# Patient Record
Sex: Female | Born: 1965 | Race: White | Hispanic: No | Marital: Married | Smoking: Never smoker
Health system: Southern US, Community
[De-identification: ages and names within clinical notes are randomized; demographics above are authoritative.]

## PROBLEM LIST (undated history)

## (undated) DIAGNOSIS — E039 Hypothyroidism, unspecified: Secondary | ICD-10-CM

## (undated) DIAGNOSIS — I10 Essential (primary) hypertension: Secondary | ICD-10-CM

## (undated) DIAGNOSIS — Z22322 Carrier or suspected carrier of Methicillin resistant Staphylococcus aureus: Secondary | ICD-10-CM

## (undated) DIAGNOSIS — F419 Anxiety disorder, unspecified: Secondary | ICD-10-CM

## (undated) DIAGNOSIS — A4902 Methicillin resistant Staphylococcus aureus infection, unspecified site: Secondary | ICD-10-CM

## (undated) DIAGNOSIS — F319 Bipolar disorder, unspecified: Secondary | ICD-10-CM

## (undated) DIAGNOSIS — R39198 Other difficulties with micturition: Secondary | ICD-10-CM

## (undated) DIAGNOSIS — Z8709 Personal history of other diseases of the respiratory system: Secondary | ICD-10-CM

## (undated) DIAGNOSIS — M549 Dorsalgia, unspecified: Secondary | ICD-10-CM

## (undated) DIAGNOSIS — G4730 Sleep apnea, unspecified: Secondary | ICD-10-CM

## (undated) DIAGNOSIS — G4700 Insomnia, unspecified: Secondary | ICD-10-CM

## (undated) DIAGNOSIS — F32A Depression, unspecified: Secondary | ICD-10-CM

## (undated) DIAGNOSIS — M2550 Pain in unspecified joint: Secondary | ICD-10-CM

## (undated) DIAGNOSIS — R519 Headache, unspecified: Secondary | ICD-10-CM

## (undated) DIAGNOSIS — M797 Fibromyalgia: Secondary | ICD-10-CM

## (undated) DIAGNOSIS — M1990 Unspecified osteoarthritis, unspecified site: Secondary | ICD-10-CM

## (undated) DIAGNOSIS — R51 Headache: Secondary | ICD-10-CM

## (undated) DIAGNOSIS — J45909 Unspecified asthma, uncomplicated: Secondary | ICD-10-CM

## (undated) DIAGNOSIS — K219 Gastro-esophageal reflux disease without esophagitis: Secondary | ICD-10-CM

## (undated) DIAGNOSIS — J302 Other seasonal allergic rhinitis: Secondary | ICD-10-CM

## (undated) DIAGNOSIS — F329 Major depressive disorder, single episode, unspecified: Secondary | ICD-10-CM

## (undated) DIAGNOSIS — J189 Pneumonia, unspecified organism: Secondary | ICD-10-CM

## (undated) HISTORY — PX: NECK SURGERY: SHX720

## (undated) HISTORY — PX: APPENDECTOMY: SHX54

## (undated) HISTORY — PX: TUBAL LIGATION: SHX77

## (undated) HISTORY — PX: ABDOMINAL HYSTERECTOMY: SHX81

## (undated) HISTORY — PX: CHOLECYSTECTOMY: SHX55

## (undated) HISTORY — PX: JOINT REPLACEMENT: SHX530

---

## 1998-06-29 ENCOUNTER — Encounter (HOSPITAL_COMMUNITY): Admission: RE | Admit: 1998-06-29 | Discharge: 1998-09-27 | Payer: Self-pay

## 1998-08-25 ENCOUNTER — Inpatient Hospital Stay (HOSPITAL_COMMUNITY): Admission: EM | Admit: 1998-08-25 | Discharge: 1998-08-27 | Payer: Self-pay | Admitting: Emergency Medicine

## 1999-12-04 ENCOUNTER — Encounter: Payer: Self-pay | Admitting: Internal Medicine

## 1999-12-04 ENCOUNTER — Ambulatory Visit (HOSPITAL_COMMUNITY): Admission: RE | Admit: 1999-12-04 | Discharge: 1999-12-04 | Payer: Self-pay | Admitting: Internal Medicine

## 2000-06-22 ENCOUNTER — Emergency Department (HOSPITAL_COMMUNITY): Admission: EM | Admit: 2000-06-22 | Discharge: 2000-06-22 | Payer: Self-pay | Admitting: *Deleted

## 2012-03-05 DIAGNOSIS — J189 Pneumonia, unspecified organism: Secondary | ICD-10-CM

## 2012-03-05 HISTORY — DX: Pneumonia, unspecified organism: J18.9

## 2012-07-14 DIAGNOSIS — R0681 Apnea, not elsewhere classified: Secondary | ICD-10-CM | POA: Insufficient documentation

## 2012-07-14 DIAGNOSIS — M79606 Pain in leg, unspecified: Secondary | ICD-10-CM | POA: Insufficient documentation

## 2012-07-14 DIAGNOSIS — M7989 Other specified soft tissue disorders: Secondary | ICD-10-CM | POA: Insufficient documentation

## 2012-07-31 DIAGNOSIS — I872 Venous insufficiency (chronic) (peripheral): Secondary | ICD-10-CM | POA: Insufficient documentation

## 2014-12-20 ENCOUNTER — Other Ambulatory Visit: Payer: Self-pay | Admitting: Orthopedic Surgery

## 2014-12-21 ENCOUNTER — Other Ambulatory Visit: Payer: Self-pay | Admitting: Orthopedic Surgery

## 2015-01-10 ENCOUNTER — Encounter (HOSPITAL_COMMUNITY): Payer: Self-pay

## 2015-01-10 ENCOUNTER — Encounter (HOSPITAL_COMMUNITY)
Admission: RE | Admit: 2015-01-10 | Discharge: 2015-01-10 | Disposition: A | Discharge status: Home / Self Care | Payer: BLUE CROSS/BLUE SHIELD | Source: Ambulatory Visit | Attending: Orthopedic Surgery | Admitting: Orthopedic Surgery

## 2015-01-10 ENCOUNTER — Ambulatory Visit (HOSPITAL_COMMUNITY)
Admission: RE | Admit: 2015-01-10 | Discharge: 2015-01-10 | Disposition: A | Discharge status: Home / Self Care | Payer: BLUE CROSS/BLUE SHIELD | Source: Ambulatory Visit | Attending: Orthopedic Surgery | Admitting: Orthopedic Surgery

## 2015-01-10 DIAGNOSIS — Z01812 Encounter for preprocedural laboratory examination: Secondary | ICD-10-CM | POA: Insufficient documentation

## 2015-01-10 DIAGNOSIS — I10 Essential (primary) hypertension: Secondary | ICD-10-CM | POA: Insufficient documentation

## 2015-01-10 DIAGNOSIS — K219 Gastro-esophageal reflux disease without esophagitis: Secondary | ICD-10-CM | POA: Insufficient documentation

## 2015-01-10 DIAGNOSIS — E039 Hypothyroidism, unspecified: Secondary | ICD-10-CM | POA: Diagnosis not present

## 2015-01-10 DIAGNOSIS — Z01818 Encounter for other preprocedural examination: Secondary | ICD-10-CM

## 2015-01-10 HISTORY — DX: Essential (primary) hypertension: I10

## 2015-01-10 HISTORY — DX: Headache, unspecified: R51.9

## 2015-01-10 HISTORY — DX: Unspecified osteoarthritis, unspecified site: M19.90

## 2015-01-10 HISTORY — DX: Bipolar disorder, unspecified: F31.9

## 2015-01-10 HISTORY — DX: Depression, unspecified: F32.A

## 2015-01-10 HISTORY — DX: Other difficulties with micturition: R39.198

## 2015-01-10 HISTORY — DX: Insomnia, unspecified: G47.00

## 2015-01-10 HISTORY — DX: Gastro-esophageal reflux disease without esophagitis: K21.9

## 2015-01-10 HISTORY — DX: Major depressive disorder, single episode, unspecified: F32.9

## 2015-01-10 HISTORY — DX: Hypothyroidism, unspecified: E03.9

## 2015-01-10 HISTORY — DX: Pain in unspecified joint: M25.50

## 2015-01-10 HISTORY — DX: Dorsalgia, unspecified: M54.9

## 2015-01-10 HISTORY — DX: Other seasonal allergic rhinitis: J30.2

## 2015-01-10 HISTORY — DX: Headache: R51

## 2015-01-10 HISTORY — DX: Pneumonia, unspecified organism: J18.9

## 2015-01-10 HISTORY — DX: Personal history of other diseases of the respiratory system: Z87.09

## 2015-01-10 HISTORY — DX: Fibromyalgia: M79.7

## 2015-01-10 LAB — URINALYSIS, ROUTINE W REFLEX MICROSCOPIC
Bilirubin Urine: NEGATIVE
GLUCOSE, UA: NEGATIVE mg/dL
Hgb urine dipstick: NEGATIVE
KETONES UR: NEGATIVE mg/dL
NITRITE: NEGATIVE
PROTEIN: NEGATIVE mg/dL
Specific Gravity, Urine: 1.023 (ref 1.005–1.030)
UROBILINOGEN UA: 0.2 mg/dL (ref 0.0–1.0)
pH: 7 (ref 5.0–8.0)

## 2015-01-10 LAB — BASIC METABOLIC PANEL
Anion gap: 9 (ref 5–15)
BUN: 8 mg/dL (ref 6–20)
CALCIUM: 9.5 mg/dL (ref 8.9–10.3)
CO2: 26 mmol/L (ref 22–32)
CREATININE: 0.82 mg/dL (ref 0.44–1.00)
Chloride: 101 mmol/L (ref 101–111)
GFR calc Af Amer: >60 mL/min (ref >60)
GFR calc non Af Amer: >60 mL/min (ref >60)
GLUCOSE: 121 mg/dL — AB (ref 65–99)
Potassium: 3.2 mmol/L — ABNORMAL LOW (ref 3.5–5.1)
Sodium: 136 mmol/L (ref 135–145)

## 2015-01-10 LAB — TYPE AND SCREEN
ABO/RH(D): O POS
Antibody Screen: NEGATIVE

## 2015-01-10 LAB — URINE MICROSCOPIC-ADD ON

## 2015-01-10 LAB — CBC WITH DIFFERENTIAL/PLATELET
BASOS PCT: 1 %
Basophils Absolute: 0.1 10*3/uL (ref 0.0–0.1)
EOS ABS: 0.8 10*3/uL — AB (ref 0.0–0.7)
Eosinophils Relative: 10 %
HCT: 40 % (ref 36.0–46.0)
HEMOGLOBIN: 12.7 g/dL (ref 12.0–15.0)
Lymphocytes Relative: 20 %
Lymphs Abs: 1.5 10*3/uL (ref 0.7–4.0)
MCH: 26.7 pg (ref 26.0–34.0)
MCHC: 31.8 g/dL (ref 30.0–36.0)
MCV: 84 fL (ref 78.0–100.0)
Monocytes Absolute: 0.5 10*3/uL (ref 0.1–1.0)
Monocytes Relative: 6 %
NEUTROS PCT: 63 %
Neutro Abs: 4.7 10*3/uL (ref 1.7–7.7)
PLATELETS: 332 10*3/uL (ref 150–400)
RBC: 4.76 MIL/uL (ref 3.87–5.11)
RDW: 13.7 % (ref 11.5–15.5)
WBC: 7.4 10*3/uL (ref 4.0–10.5)

## 2015-01-10 LAB — SURGICAL PCR SCREEN
MRSA, PCR: NEGATIVE
STAPHYLOCOCCUS AUREUS: NEGATIVE

## 2015-01-10 LAB — PROTIME-INR
INR: 1.04 (ref 0.00–1.49)
Prothrombin Time: 13.8 seconds (ref 11.6–15.2)

## 2015-01-10 LAB — APTT: aPTT: 30 seconds (ref 24–37)

## 2015-01-10 LAB — ABO/RH: ABO/RH(D): O POS

## 2015-01-10 MED ORDER — CHLORHEXIDINE GLUCONATE 4 % EX LIQD: 60.0000 mL | Freq: Once | CUTANEOUS | Status: DC

## 2015-01-10 NOTE — Progress Notes (Signed)
Sleep study requested from [place] office

## 2015-01-10 NOTE — Progress Notes (Addendum)
Denies having a cardiologist-did see one about 3-4 yrs ago bc of legs swelling-Dr.Payvar  Medical Md is Dr.Douglas Delena Bali  Denies  CXR in past yr  Echo report in epic from 2014  Stress test done many yrs ago  Denies ever having a heart cath  EKG in epic under care everywhere but no tracing-to request from [facility]

## 2015-01-10 NOTE — Pre-Procedure Instructions (Signed)
Deborah Maldonado  01/10/2015      [facility], [place], [name] [address] Phone: [phone] Fax: [phone]    Your procedure is scheduled on Wed, Nov 16 @ 12:45 PM  Report to [facility] Admitting at 10:45 AM  Call this number if you have problems the morning of surgery:  [phone]   Remember:  Do not eat food or drink liquids after midnight.  Take these medicines the morning of surgery with A SIP OF WATER Dymista(Azelastine-Fluticasone),Dexilant(Dexlansoprazole),Allegra(Fexofenadine),Prozac(Fluoxetine),Synthroid(Levothyroxine),Lithium(Eskalith),and Lyrica(Pregabalin)                Stop taking your Ibuprofen and Diclofenac. No Goody's,BC's,Aleve,Motrin,Advil,Fish Oil,or any Herbal Medications.    Do not wear jewelry, make-up or nail polish.  Do not wear lotions, powders, or perfumes.  You may wear deodorant.  Do not shave 48 hours prior to surgery.    Do not bring valuables to the hospital.  [facility] is not responsible for any belongings or valuables.  Contacts, dentures or bridgework may not be worn into surgery.  Leave your suitcase in the car.  After surgery it may be brought to your room.  For patients admitted to the hospital, discharge time will be determined by your treatment team.  Patients discharged the day of surgery will not be allowed to drive home.    Special instructions:  Deborah Maldonado - Preparing for Surgery  Before surgery, you can play an important role.  Because skin is not sterile, your skin needs to be as free of germs as possible.  You can reduce the number of germs on you skin by washing with CHG (chlorahexidine gluconate) soap before surgery.  CHG is an antiseptic cleaner which kills germs and bonds with the skin to continue killing germs even after washing.  Please DO NOT use if you have an allergy to CHG or antibacterial soaps.  If your skin becomes reddened/irritated  stop using the CHG and inform your nurse when you arrive at Short Stay.  Do not shave (including legs and underarms) for at least 48 hours prior to the first CHG shower.  You may shave your face.  Please follow these instructions carefully:   1.  Shower with CHG Soap the night before surgery and the                                morning of Surgery.  2.  If you choose to wash your hair, wash your hair first as usual with your       normal shampoo.  3.  After you shampoo, rinse your hair and body thoroughly to remove the                      Shampoo.  4.  Use CHG as you would any other liquid soap.  You can apply chg directly       to the skin and wash gently with scrungie or a clean washcloth.  5.  Apply the CHG Soap to your body ONLY FROM THE NECK DOWN.        Do not use on open wounds or open sores.  Avoid contact with your eyes,       ears, mouth and genitals (private parts).  Wash genitals (private parts)       with your  normal soap.  6.  Wash thoroughly, paying special attention to the area where your surgery        will be performed.  7.  Thoroughly rinse your body with warm water from the neck down.  8.  DO NOT shower/wash with your normal soap after using and rinsing off       the CHG Soap.  9.  Pat yourself dry with a clean towel.            10.  Wear clean pajamas.            11.  Place clean sheets on your bed the night of your first shower and do not        sleep with pets.  Day of Surgery  Do not apply any lotions/deoderants the morning of surgery.  Please wear clean clothes to the hospital/surgery center.    Please read over the following fact sheets that you were given. Pain Booklet, Coughing and Deep Breathing, Blood Transfusion Information, MRSA Information and Surgical Site Infection Prevention

## 2015-01-11 NOTE — Progress Notes (Signed)
Anesthesia Chart Review:  Pt is 49 year old female scheduled for R total hip arthroplasty on 01/19/2015 with Dr. Mayer Camel.   PMH includes: HTN, hypothyroidism, bipolar disorder, GERD. Never smoker. BMI 39.   Medications include: dexlansoprazole, levothyroxine, lithium, losartan-hctz, potassium.  Preoperative labs reviewed.    Chest x-ray 01/10/15 reviewed. No active cardiopulmonary disease.   EKG 08/19/14 (on paper chart): sinus rhythm. RBBB.   Echo 07/16/12: 1. Normal LV systolic function, GHWE=99-37%. No regional wall motion abnormality noted. No left ventricular hypertrophy 2. No aortic stenosis. No aortic regurgitation. Normal aortic root 3. No mitral annular calcification. No mitral valve prolapse. No mitral regurgitation 4. Tricuspid valve normal. Mild tricuspid regurgitation 5. No right atrial enlargement. No right ventricular enlargement. Mild left atrial enlargement. Atrial septum intact 6. No pericardial effusion. No pleural effusion 7. Endocarditis prophylaxis is not recommended in this patient.  If no changes, I anticipate pt can proceed with surgery as scheduled.   Willeen Cass, FNP-BC [facility] Short Stay Surgical Center/Anesthesiology Phone: [phone] 01/11/2015 2:30 PM

## 2015-01-18 MED ORDER — VANCOMYCIN HCL 10 G IV SOLR
1500.0000 mg | INTRAVENOUS | Status: AC
  Administered 2015-01-19: 1500 mg via INTRAVENOUS
  Filled 2015-01-18: qty 1500

## 2015-01-18 NOTE — H&P (Signed)
TOTAL HIP ADMISSION H&P  Patient is admitted for right total hip arthroplasty.  Subjective:  Chief Complaint: right hip pain  HPI: Deborah Maldonado, 49 y.o. female, has a history of pain and functional disability in the right hip(s) due to arthritis and patient has failed non-surgical conservative treatments for greater than 12 weeks to include NSAID's and/or analgesics, flexibility and strengthening excercises, weight reduction as appropriate and activity modification.  Onset of symptoms was gradual starting 1 years ago with gradually worsening course since that time.The patient noted no past surgery on the right hip(s).  Patient currently rates pain in the right hip at 10 out of 10 with activity. Patient has night pain, worsening of pain with activity and weight bearing, pain that interfers with activities of daily living and pain with passive range of motion. Patient has evidence of joint space narrowing by imaging studies. This condition presents safety issues increasing the risk of falls.   There is no current active infection.  There are no active problems to display for this patient.  Past Medical History  Diagnosis Date  . Hypertension     takes Hyzaar daily  . Hypothyroidism     takes Synthroid daily  . Depression     takes Prozac daily  . Seasonal allergies     uses Dymista daily and takes Allegra daily  . Pneumonia 2014  . History of bronchitis early 2016  . Headache     occasionally  . Fibromyalgia     takes Lyrica daily  . Arthritis   . Joint pain   . Back pain     facet joint encapsulated   . GERD (gastroesophageal reflux disease)     takes Dexilant daily  . Slow urinary stream     bladder stem stretched this year  . Bipolar disorder ([place])     takes Lithium daily  . Insomnia     takes Triazolam nightly    Past Surgical History  Procedure Laterality Date  . Eye surgery    . Cholecystectomy    . Appendectomy    . Abdominal hysterectomy    . Tubal ligation       No prescriptions prior to admission   Allergies  Allergen Reactions  . Penicillins Anaphylaxis  . Morphine And Related     Bad headache    Social History  Substance Use Topics  . Smoking status: Never Smoker   . Smokeless tobacco: Not on file  . Alcohol Use: No    No family history on file.   Review of Systems  Constitutional: Positive for malaise/fatigue.  Cardiovascular: Positive for leg swelling.  Gastrointestinal: Positive for heartburn.  Musculoskeletal: Positive for myalgias and joint pain.  Neurological: Positive for headaches.  Endo/Heme/Allergies: Positive for polydipsia.  Psychiatric/Behavioral: The patient is nervous/anxious and has insomnia.     Objective:  Physical Exam  Constitutional: She is oriented to person, place, and time. She appears well-developed and well-nourished.  HENT:  Head: Normocephalic and atraumatic.  Eyes: Pupils are equal, round, and reactive to light.  Neck: Normal range of motion. Neck supple.  Cardiovascular: Intact distal pulses.   Respiratory: Effort normal.  Musculoskeletal: She exhibits tenderness.  Very irritable right hip with internal and external rotation.  Foot tap is negative.  She can flex to 95, abduct to 45, abduct 20 pain with adduction.  Standing, she does have some pannus that goes over the inguinal fold.    Neurological: She is alert and oriented to person, place,  and time.  Skin: Skin is warm and dry.  Psychiatric: She has a normal mood and affect. Her behavior is normal. Judgment and thought content normal.    Vital signs in last 24 hours:    Labs:   There is no height or weight on file to calculate BMI.   Imaging Review Plain radiographs demonstrate Multiple views of the right hip are reviewed in office today.  Patient does have moderate to severe arthritis of the right hip.  She is down to her last millimeter of cartilage superiorly.  Assessment/Plan:  End stage arthritis, right hip(s)  The  patient history, physical examination, clinical judgement of the provider and imaging studies are consistent with end stage degenerative joint disease of the right hip(s) and total hip arthroplasty is deemed medically necessary. The treatment options including medical management, injection therapy, arthroscopy and arthroplasty were discussed at length. The risks and benefits of total hip arthroplasty were presented and reviewed. The risks due to aseptic loosening, infection, stiffness, dislocation/subluxation,  thromboembolic complications and other imponderables were discussed.  The patient acknowledged the explanation, agreed to proceed with the plan and consent was signed. Patient is being admitted for inpatient treatment for surgery, pain control, PT, OT, prophylactic antibiotics, VTE prophylaxis, progressive ambulation and ADL's and discharge planning.The patient is planning to be discharged home with home health services

## 2015-01-19 ENCOUNTER — Inpatient Hospital Stay (HOSPITAL_COMMUNITY): Payer: BLUE CROSS/BLUE SHIELD | Admitting: Emergency Medicine

## 2015-01-19 ENCOUNTER — Inpatient Hospital Stay (HOSPITAL_COMMUNITY): Payer: BLUE CROSS/BLUE SHIELD | Admitting: Anesthesiology

## 2015-01-19 ENCOUNTER — Encounter (HOSPITAL_COMMUNITY)
Admission: RE | Disposition: A | Discharge status: Home Health | Payer: Self-pay | Source: Ambulatory Visit | Attending: Orthopedic Surgery

## 2015-01-19 ENCOUNTER — Encounter (HOSPITAL_COMMUNITY): Payer: Self-pay | Admitting: *Deleted

## 2015-01-19 ENCOUNTER — Inpatient Hospital Stay (HOSPITAL_COMMUNITY): Payer: BLUE CROSS/BLUE SHIELD

## 2015-01-19 ENCOUNTER — Inpatient Hospital Stay (HOSPITAL_COMMUNITY)
Admission: RE | Admit: 2015-01-19 | Discharge: 2015-01-21 | DRG: 470 | Disposition: A | Discharge status: Home Health | Payer: BLUE CROSS/BLUE SHIELD | Source: Ambulatory Visit | Attending: Orthopedic Surgery | Admitting: Orthopedic Surgery

## 2015-01-19 DIAGNOSIS — F319 Bipolar disorder, unspecified: Secondary | ICD-10-CM | POA: Diagnosis present

## 2015-01-19 DIAGNOSIS — D62 Acute posthemorrhagic anemia: Secondary | ICD-10-CM | POA: Diagnosis not present

## 2015-01-19 DIAGNOSIS — Z7982 Long term (current) use of aspirin: Secondary | ICD-10-CM | POA: Diagnosis not present

## 2015-01-19 DIAGNOSIS — Z885 Allergy status to narcotic agent status: Secondary | ICD-10-CM | POA: Diagnosis not present

## 2015-01-19 DIAGNOSIS — E669 Obesity, unspecified: Secondary | ICD-10-CM | POA: Diagnosis present

## 2015-01-19 DIAGNOSIS — J302 Other seasonal allergic rhinitis: Secondary | ICD-10-CM | POA: Diagnosis present

## 2015-01-19 DIAGNOSIS — M797 Fibromyalgia: Secondary | ICD-10-CM | POA: Diagnosis present

## 2015-01-19 DIAGNOSIS — G4700 Insomnia, unspecified: Secondary | ICD-10-CM | POA: Diagnosis present

## 2015-01-19 DIAGNOSIS — K219 Gastro-esophageal reflux disease without esophagitis: Secondary | ICD-10-CM | POA: Diagnosis present

## 2015-01-19 DIAGNOSIS — Z886 Allergy status to analgesic agent status: Secondary | ICD-10-CM | POA: Diagnosis not present

## 2015-01-19 DIAGNOSIS — Z8701 Personal history of pneumonia (recurrent): Secondary | ICD-10-CM | POA: Diagnosis not present

## 2015-01-19 DIAGNOSIS — I10 Essential (primary) hypertension: Secondary | ICD-10-CM | POA: Diagnosis present

## 2015-01-19 DIAGNOSIS — M549 Dorsalgia, unspecified: Secondary | ICD-10-CM | POA: Diagnosis present

## 2015-01-19 DIAGNOSIS — Z96649 Presence of unspecified artificial hip joint: Secondary | ICD-10-CM

## 2015-01-19 DIAGNOSIS — Z6839 Body mass index (BMI) 39.0-39.9, adult: Secondary | ICD-10-CM

## 2015-01-19 DIAGNOSIS — M1611 Unilateral primary osteoarthritis, right hip: Secondary | ICD-10-CM | POA: Diagnosis present

## 2015-01-19 DIAGNOSIS — Z880 Allergy status to penicillin: Secondary | ICD-10-CM | POA: Diagnosis not present

## 2015-01-19 DIAGNOSIS — E039 Hypothyroidism, unspecified: Secondary | ICD-10-CM | POA: Diagnosis present

## 2015-01-19 HISTORY — PX: TOTAL HIP ARTHROPLASTY: SHX124

## 2015-01-19 SURGERY — ARTHROPLASTY, HIP, TOTAL,POSTERIOR APPROACH
Anesthesia: General | Site: Hip | Laterality: Right

## 2015-01-19 MED ORDER — OXYCODONE HCL 5 MG PO TABS
5.0000 mg | ORAL_TABLET | ORAL | Status: DC | PRN
  Administered 2015-01-19 – 2015-01-21 (×8): 10 mg via ORAL
  Filled 2015-01-19 (×9): qty 2

## 2015-01-19 MED ORDER — KETOROLAC TROMETHAMINE 30 MG/ML IJ SOLN
30.0000 mg | Freq: Once | INTRAMUSCULAR | Status: AC
  Administered 2015-01-19: 30 mg via INTRAVENOUS

## 2015-01-19 MED ORDER — HYDROMORPHONE HCL 1 MG/ML IJ SOLN
INTRAMUSCULAR | Status: AC
  Administered 2015-01-19: 0.5 mg via INTRAVENOUS
  Filled 2015-01-19: qty 1

## 2015-01-19 MED ORDER — FENTANYL CITRATE (PF) 250 MCG/5ML IJ SOLN
INTRAMUSCULAR | Status: AC
  Filled 2015-01-19: qty 5

## 2015-01-19 MED ORDER — FENTANYL CITRATE (PF) 100 MCG/2ML IJ SOLN
INTRAMUSCULAR | Status: DC | PRN
  Administered 2015-01-19: 50 ug via INTRAVENOUS
  Administered 2015-01-19: 150 ug via INTRAVENOUS
  Administered 2015-01-19: 50 ug via INTRAVENOUS
  Administered 2015-01-19 (×2): 100 ug via INTRAVENOUS
  Administered 2015-01-19: 150 ug via INTRAVENOUS

## 2015-01-19 MED ORDER — LIDOCAINE HCL (CARDIAC) 20 MG/ML IV SOLN
INTRAVENOUS | Status: DC | PRN
  Administered 2015-01-19: 60 mg via INTRAVENOUS

## 2015-01-19 MED ORDER — GLYCOPYRROLATE 0.2 MG/ML IJ SOLN
INTRAMUSCULAR | Status: DC | PRN
  Administered 2015-01-19: 0.4 mg via INTRAVENOUS

## 2015-01-19 MED ORDER — ONDANSETRON HCL 4 MG/2ML IJ SOLN: 4.0000 mg | Freq: Four times a day (QID) | INTRAMUSCULAR | Status: DC | PRN

## 2015-01-19 MED ORDER — DOCUSATE SODIUM 100 MG PO CAPS
100.0000 mg | ORAL_CAPSULE | Freq: Two times a day (BID) | ORAL | Status: DC
  Administered 2015-01-19 – 2015-01-21 (×4): 100 mg via ORAL
  Filled 2015-01-19 (×4): qty 1

## 2015-01-19 MED ORDER — LOSARTAN POTASSIUM-HCTZ 100-25 MG PO TABS: 1.0000 | ORAL_TABLET | Freq: Every day | ORAL | Status: DC

## 2015-01-19 MED ORDER — LACTATED RINGERS IV SOLN
INTRAVENOUS | Status: DC
  Administered 2015-01-19: 11:00:00 via INTRAVENOUS

## 2015-01-19 MED ORDER — POTASSIUM CHLORIDE CRYS ER 20 MEQ PO TBCR
40.0000 meq | EXTENDED_RELEASE_TABLET | Freq: Two times a day (BID) | ORAL | Status: DC
  Administered 2015-01-19 – 2015-01-21 (×4): 40 meq via ORAL
  Filled 2015-01-19 (×4): qty 2

## 2015-01-19 MED ORDER — MIDAZOLAM HCL 2 MG/2ML IJ SOLN
INTRAMUSCULAR | Status: AC
  Filled 2015-01-19: qty 2

## 2015-01-19 MED ORDER — SENNOSIDES-DOCUSATE SODIUM 8.6-50 MG PO TABS: 1.0000 | ORAL_TABLET | Freq: Every evening | ORAL | Status: DC | PRN

## 2015-01-19 MED ORDER — ASPIRIN EC 325 MG PO TBEC
325.0000 mg | DELAYED_RELEASE_TABLET | Freq: Every day | ORAL | Status: DC
  Administered 2015-01-20 – 2015-01-21 (×2): 325 mg via ORAL
  Filled 2015-01-19 (×2): qty 1

## 2015-01-19 MED ORDER — METHOCARBAMOL 500 MG PO TABS
ORAL_TABLET | ORAL | Status: AC
  Filled 2015-01-19: qty 1

## 2015-01-19 MED ORDER — LIDOCAINE HCL (CARDIAC) 20 MG/ML IV SOLN
INTRAVENOUS | Status: AC
  Filled 2015-01-19: qty 5

## 2015-01-19 MED ORDER — PREGABALIN 75 MG PO CAPS
225.0000 mg | ORAL_CAPSULE | Freq: Two times a day (BID) | ORAL | Status: DC
Start: 2015-01-19 — End: 2015-01-21
  Administered 2015-01-19 – 2015-01-21 (×4): 225 mg via ORAL
  Filled 2015-01-19 (×4): qty 3

## 2015-01-19 MED ORDER — HYDROMORPHONE HCL 1 MG/ML IJ SOLN
0.5000 mg | INTRAMUSCULAR | Status: DC | PRN
  Administered 2015-01-19 – 2015-01-20 (×3): 1 mg via INTRAVENOUS
  Filled 2015-01-19 (×3): qty 1

## 2015-01-19 MED ORDER — KETOROLAC TROMETHAMINE 30 MG/ML IJ SOLN
INTRAMUSCULAR | Status: AC
  Filled 2015-01-19: qty 1

## 2015-01-19 MED ORDER — PROPOFOL 10 MG/ML IV BOLUS
INTRAVENOUS | Status: AC
  Filled 2015-01-19: qty 20

## 2015-01-19 MED ORDER — INFLUENZA VAC SPLIT QUAD 0.5 ML IM SUSY
0.5000 mL | PREFILLED_SYRINGE | INTRAMUSCULAR | Status: AC
  Administered 2015-01-20: 0.5 mL via INTRAMUSCULAR
  Filled 2015-01-19: qty 0.5

## 2015-01-19 MED ORDER — LACTATED RINGERS IV SOLN
INTRAVENOUS | Status: DC | PRN
  Administered 2015-01-19 (×2): via INTRAVENOUS

## 2015-01-19 MED ORDER — SODIUM CHLORIDE 0.9 % IR SOLN
Status: DC | PRN
  Administered 2015-01-19: 1000 mL

## 2015-01-19 MED ORDER — NEOSTIGMINE METHYLSULFATE 10 MG/10ML IV SOLN
INTRAVENOUS | Status: DC | PRN
  Administered 2015-01-19: 3 mg via INTRAVENOUS

## 2015-01-19 MED ORDER — BISACODYL 5 MG PO TBEC: 5.0000 mg | DELAYED_RELEASE_TABLET | Freq: Every day | ORAL | Status: DC | PRN

## 2015-01-19 MED ORDER — OXYCODONE-ACETAMINOPHEN 5-325 MG PO TABS: 1.0000 | ORAL_TABLET | ORAL | Status: DC | PRN

## 2015-01-19 MED ORDER — PHENOL 1.4 % MT LIQD: 1.0000 | OROMUCOSAL | Status: DC | PRN

## 2015-01-19 MED ORDER — LITHIUM CARBONATE ER 450 MG PO TBCR
450.0000 mg | EXTENDED_RELEASE_TABLET | Freq: Four times a day (QID) | ORAL | Status: DC
Start: 2015-01-19 — End: 2015-01-21
  Administered 2015-01-19 – 2015-01-21 (×6): 450 mg via ORAL
  Filled 2015-01-19 (×10): qty 1

## 2015-01-19 MED ORDER — TRANEXAMIC ACID 1000 MG/10ML IV SOLN
1000.0000 mg | INTRAVENOUS | Status: AC
  Administered 2015-01-19: 1000 mg via INTRAVENOUS
  Filled 2015-01-19: qty 10

## 2015-01-19 MED ORDER — ONDANSETRON HCL 4 MG/2ML IJ SOLN
INTRAMUSCULAR | Status: AC
  Filled 2015-01-19: qty 2

## 2015-01-19 MED ORDER — BUPIVACAINE-EPINEPHRINE 0.5% -1:200000 IJ SOLN
INTRAMUSCULAR | Status: DC | PRN
  Administered 2015-01-19: 10 mL

## 2015-01-19 MED ORDER — KCL IN DEXTROSE-NACL 20-5-0.45 MEQ/L-%-% IV SOLN
INTRAVENOUS | Status: DC
Start: 2015-01-19 — End: 2015-01-21
  Administered 2015-01-19 (×2): via INTRAVENOUS
  Filled 2015-01-19: qty 1000

## 2015-01-19 MED ORDER — METHOCARBAMOL 500 MG PO TABS: 500.0000 mg | ORAL_TABLET | Freq: Two times a day (BID) | ORAL | Status: DC

## 2015-01-19 MED ORDER — PANTOPRAZOLE SODIUM 40 MG PO TBEC
40.0000 mg | DELAYED_RELEASE_TABLET | Freq: Every day | ORAL | Status: DC
  Administered 2015-01-19 – 2015-01-21 (×3): 40 mg via ORAL
  Filled 2015-01-19 (×3): qty 1

## 2015-01-19 MED ORDER — PROPOFOL 10 MG/ML IV BOLUS
INTRAVENOUS | Status: DC | PRN
  Administered 2015-01-19 (×2): 50 mg via INTRAVENOUS
  Administered 2015-01-19: 150 mg via INTRAVENOUS

## 2015-01-19 MED ORDER — FLEET ENEMA 7-19 GM/118ML RE ENEM: 1.0000 | ENEMA | Freq: Once | RECTAL | Status: DC | PRN

## 2015-01-19 MED ORDER — ACETAMINOPHEN 650 MG RE SUPP: 650.0000 mg | Freq: Four times a day (QID) | RECTAL | Status: DC | PRN

## 2015-01-19 MED ORDER — ROCURONIUM BROMIDE 50 MG/5ML IV SOLN
INTRAVENOUS | Status: AC
  Filled 2015-01-19: qty 1

## 2015-01-19 MED ORDER — METHOCARBAMOL 500 MG PO TABS
500.0000 mg | ORAL_TABLET | Freq: Four times a day (QID) | ORAL | Status: DC | PRN
  Administered 2015-01-19 – 2015-01-21 (×6): 500 mg via ORAL
  Filled 2015-01-19 (×6): qty 1

## 2015-01-19 MED ORDER — METHOCARBAMOL 1000 MG/10ML IJ SOLN
500.0000 mg | Freq: Four times a day (QID) | INTRAVENOUS | Status: DC | PRN
  Filled 2015-01-19: qty 5

## 2015-01-19 MED ORDER — OXYCODONE HCL 5 MG/5ML PO SOLN: 5.0000 mg | Freq: Once | ORAL | Status: DC | PRN

## 2015-01-19 MED ORDER — ASPIRIN EC 325 MG PO TBEC: 325.0000 mg | DELAYED_RELEASE_TABLET | Freq: Two times a day (BID) | ORAL | Status: DC

## 2015-01-19 MED ORDER — TRIAZOLAM 0.125 MG PO TABS
0.2500 mg | ORAL_TABLET | Freq: Every day | ORAL | Status: DC
  Administered 2015-01-19 – 2015-01-20 (×2): 0.25 mg via ORAL
  Filled 2015-01-19 (×2): qty 2

## 2015-01-19 MED ORDER — BUPIVACAINE-EPINEPHRINE (PF) 0.5% -1:200000 IJ SOLN
INTRAMUSCULAR | Status: AC
  Filled 2015-01-19: qty 30

## 2015-01-19 MED ORDER — ONDANSETRON HCL 4 MG/2ML IJ SOLN
INTRAMUSCULAR | Status: DC | PRN
  Administered 2015-01-19: 4 mg via INTRAVENOUS

## 2015-01-19 MED ORDER — ALUM & MAG HYDROXIDE-SIMETH 200-200-20 MG/5ML PO SUSP: 30.0000 mL | ORAL | Status: DC | PRN

## 2015-01-19 MED ORDER — LOSARTAN POTASSIUM 50 MG PO TABS
100.0000 mg | ORAL_TABLET | Freq: Every day | ORAL | Status: DC
  Administered 2015-01-20 – 2015-01-21 (×2): 100 mg via ORAL
  Filled 2015-01-19 (×2): qty 2

## 2015-01-19 MED ORDER — HYDROCHLOROTHIAZIDE 25 MG PO TABS
25.0000 mg | ORAL_TABLET | Freq: Every day | ORAL | Status: DC
  Administered 2015-01-20 – 2015-01-21 (×2): 25 mg via ORAL
  Filled 2015-01-19 (×2): qty 1

## 2015-01-19 MED ORDER — DEXAMETHASONE SODIUM PHOSPHATE 10 MG/ML IJ SOLN
10.0000 mg | Freq: Once | INTRAMUSCULAR | Status: AC
  Administered 2015-01-20: 10 mg via INTRAVENOUS
  Filled 2015-01-19: qty 1

## 2015-01-19 MED ORDER — DIPHENHYDRAMINE HCL 12.5 MG/5ML PO ELIX: 12.5000 mg | ORAL_SOLUTION | ORAL | Status: DC | PRN

## 2015-01-19 MED ORDER — MIDAZOLAM HCL 5 MG/5ML IJ SOLN
INTRAMUSCULAR | Status: DC | PRN
  Administered 2015-01-19: 2 mg via INTRAVENOUS

## 2015-01-19 MED ORDER — LORATADINE 10 MG PO TABS
10.0000 mg | ORAL_TABLET | Freq: Every day | ORAL | Status: DC
  Administered 2015-01-19 – 2015-01-21 (×3): 10 mg via ORAL
  Filled 2015-01-19 (×3): qty 1

## 2015-01-19 MED ORDER — ACETAMINOPHEN 325 MG PO TABS
650.0000 mg | ORAL_TABLET | Freq: Four times a day (QID) | ORAL | Status: DC | PRN
  Administered 2015-01-21: 650 mg via ORAL
  Filled 2015-01-19: qty 2

## 2015-01-19 MED ORDER — METOCLOPRAMIDE HCL 5 MG/ML IJ SOLN: 5.0000 mg | Freq: Three times a day (TID) | INTRAMUSCULAR | Status: DC | PRN

## 2015-01-19 MED ORDER — OXYCODONE HCL 5 MG PO TABS: 5.0000 mg | ORAL_TABLET | Freq: Once | ORAL | Status: DC | PRN

## 2015-01-19 MED ORDER — KCL IN DEXTROSE-NACL 20-5-0.45 MEQ/L-%-% IV SOLN
INTRAVENOUS | Status: AC
  Filled 2015-01-19: qty 1000

## 2015-01-19 MED ORDER — FLUOXETINE HCL 20 MG PO CAPS
20.0000 mg | ORAL_CAPSULE | Freq: Every day | ORAL | Status: DC
Start: 2015-01-19 — End: 2015-01-21
  Administered 2015-01-19 – 2015-01-21 (×3): 20 mg via ORAL
  Filled 2015-01-19 (×3): qty 1

## 2015-01-19 MED ORDER — GLYCOPYRROLATE 0.2 MG/ML IJ SOLN
INTRAMUSCULAR | Status: AC
  Filled 2015-01-19: qty 2

## 2015-01-19 MED ORDER — METOCLOPRAMIDE HCL 5 MG PO TABS: 5.0000 mg | ORAL_TABLET | Freq: Three times a day (TID) | ORAL | Status: DC | PRN

## 2015-01-19 MED ORDER — HYDROMORPHONE HCL 1 MG/ML IJ SOLN
0.2500 mg | INTRAMUSCULAR | Status: DC | PRN
  Administered 2015-01-19 (×3): 0.5 mg via INTRAVENOUS

## 2015-01-19 MED ORDER — ONDANSETRON HCL 4 MG PO TABS: 4.0000 mg | ORAL_TABLET | Freq: Four times a day (QID) | ORAL | Status: DC | PRN

## 2015-01-19 MED ORDER — LEVOTHYROXINE SODIUM 150 MCG PO TABS
150.0000 ug | ORAL_TABLET | Freq: Every day | ORAL | Status: DC
  Administered 2015-01-20 – 2015-01-21 (×2): 150 ug via ORAL
  Filled 2015-01-19 (×2): qty 1

## 2015-01-19 MED ORDER — NEOSTIGMINE METHYLSULFATE 10 MG/10ML IV SOLN
INTRAVENOUS | Status: AC
  Filled 2015-01-19: qty 1

## 2015-01-19 MED ORDER — MENTHOL 3 MG MT LOZG: 1.0000 | LOZENGE | OROMUCOSAL | Status: DC | PRN

## 2015-01-19 MED ORDER — PROMETHAZINE HCL 25 MG/ML IJ SOLN: 6.2500 mg | INTRAMUSCULAR | Status: DC | PRN

## 2015-01-19 MED ORDER — DEXTROSE-NACL 5-0.45 % IV SOLN: INTRAVENOUS | Status: DC

## 2015-01-19 MED ORDER — DIPHENHYDRAMINE HCL 25 MG PO TABS
50.0000 mg | ORAL_TABLET | Freq: Every day | ORAL | Status: DC
  Administered 2015-01-19 – 2015-01-20 (×2): 50 mg via ORAL
  Filled 2015-01-19 (×5): qty 2

## 2015-01-19 MED ORDER — ROCURONIUM BROMIDE 100 MG/10ML IV SOLN
INTRAVENOUS | Status: DC | PRN
  Administered 2015-01-19: 50 mg via INTRAVENOUS
  Administered 2015-01-19: 20 mg via INTRAVENOUS

## 2015-01-19 MED ORDER — AZELASTINE-FLUTICASONE 137-50 MCG/ACT NA SUSP: 1.0000 | Freq: Two times a day (BID) | NASAL | Status: DC

## 2015-01-19 SURGICAL SUPPLY — 61 items
BLADE SAW SGTL 18X1.27X75 (BLADE) ×2 IMPLANT
BRUSH FEMORAL CANAL (MISCELLANEOUS) IMPLANT
CAPT HIP TOTAL 2 ×1 IMPLANT
COVER BACK TABLE 24X17X13 BIG (DRAPES) IMPLANT
COVER SURGICAL LIGHT HANDLE (MISCELLANEOUS) ×3 IMPLANT
DRAPE IMP U-DRAPE 54X76 (DRAPES) ×2 IMPLANT
DRAPE ORTHO SPLIT 77X108 STRL (DRAPES) ×2
DRAPE PROXIMA HALF (DRAPES) ×2 IMPLANT
DRAPE SURG ORHT 6 SPLT 77X108 (DRAPES) ×1 IMPLANT
DRAPE U-SHAPE 47X51 STRL (DRAPES) ×2 IMPLANT
DRILL BIT 7/64X5 (BIT) ×2 IMPLANT
DRSG AQUACEL AG ADV 3.5X14 (GAUZE/BANDAGES/DRESSINGS) ×1 IMPLANT
DURAPREP 26ML APPLICATOR (WOUND CARE) ×3 IMPLANT
ELECT BLADE 4.0 EZ CLEAN MEGAD (MISCELLANEOUS)
ELECT REM PT RETURN 9FT ADLT (ELECTROSURGICAL) ×2
ELECTRODE BLDE 4.0 EZ CLN MEGD (MISCELLANEOUS) IMPLANT
ELECTRODE REM PT RTRN 9FT ADLT (ELECTROSURGICAL) ×1 IMPLANT
GLOVE BIO SURGEON STRL SZ7.5 (GLOVE) ×2 IMPLANT
GLOVE BIO SURGEON STRL SZ8.5 (GLOVE) ×4 IMPLANT
GLOVE BIOGEL PI IND STRL 6.5 (GLOVE) IMPLANT
GLOVE BIOGEL PI IND STRL 7.5 (GLOVE) IMPLANT
GLOVE BIOGEL PI IND STRL 8 (GLOVE) ×2 IMPLANT
GLOVE BIOGEL PI IND STRL 9 (GLOVE) ×1 IMPLANT
GLOVE BIOGEL PI INDICATOR 6.5 (GLOVE) ×1
GLOVE BIOGEL PI INDICATOR 7.5 (GLOVE) ×1
GLOVE BIOGEL PI INDICATOR 8 (GLOVE) ×2
GLOVE BIOGEL PI INDICATOR 9 (GLOVE) ×1
GLOVE SURG SS PI 6.5 STRL IVOR (GLOVE) ×2 IMPLANT
GOWN STRL REUS W/ TWL LRG LVL3 (GOWN DISPOSABLE) ×2 IMPLANT
GOWN STRL REUS W/ TWL XL LVL3 (GOWN DISPOSABLE) ×2 IMPLANT
GOWN STRL REUS W/TWL LRG LVL3 (GOWN DISPOSABLE) ×4
GOWN STRL REUS W/TWL XL LVL3 (GOWN DISPOSABLE) ×4
HANDPIECE INTERPULSE COAX TIP (DISPOSABLE)
HOOD PEEL AWAY FACE SHEILD DIS (HOOD) ×4 IMPLANT
KIT BASIN OR (CUSTOM PROCEDURE TRAY) ×2 IMPLANT
KIT ROOM TURNOVER OR (KITS) ×2 IMPLANT
MANIFOLD NEPTUNE II (INSTRUMENTS) ×2 IMPLANT
NEEDLE 22X1 1/2 (OR ONLY) (NEEDLE) ×2 IMPLANT
NS IRRIG 1000ML POUR BTL (IV SOLUTION) ×2 IMPLANT
PACK TOTAL JOINT (CUSTOM PROCEDURE TRAY) ×2 IMPLANT
PACK UNIVERSAL I (CUSTOM PROCEDURE TRAY) ×2 IMPLANT
PAD ARMBOARD 7.5X6 YLW CONV (MISCELLANEOUS) ×4 IMPLANT
PASSER SUT SWANSON 36MM LOOP (INSTRUMENTS) ×2 IMPLANT
PRESSURIZER FEMORAL UNIV (MISCELLANEOUS) IMPLANT
SET HNDPC FAN SPRY TIP SCT (DISPOSABLE) IMPLANT
SPONGE LAP 18X18 X RAY DECT (DISPOSABLE) ×1 IMPLANT
SUT ETHIBOND 2 V 37 (SUTURE) ×2 IMPLANT
SUT VIC AB 0 CT1 27 (SUTURE) ×2
SUT VIC AB 0 CT1 27XBRD ANBCTR (SUTURE) IMPLANT
SUT VIC AB 0 CTB1 27 (SUTURE) ×2 IMPLANT
SUT VIC AB 1 CTX 36 (SUTURE) ×2
SUT VIC AB 1 CTX36XBRD ANBCTR (SUTURE) ×1 IMPLANT
SUT VIC AB 2-0 CTB1 (SUTURE) ×2 IMPLANT
SUT VIC AB 3-0 SH 27 (SUTURE) ×2
SUT VIC AB 3-0 SH 27X BRD (SUTURE) ×1 IMPLANT
SYR CONTROL 10ML LL (SYRINGE) ×2 IMPLANT
TOWEL OR 17X24 6PK STRL BLUE (TOWEL DISPOSABLE) ×2 IMPLANT
TOWEL OR 17X26 10 PK STRL BLUE (TOWEL DISPOSABLE) ×2 IMPLANT
TOWER CARTRIDGE SMART MIX (DISPOSABLE) IMPLANT
TRAY FOLEY CATH 14FR (SET/KITS/TRAYS/PACK) IMPLANT
WATER STERILE IRR 1000ML POUR (IV SOLUTION) ×5 IMPLANT

## 2015-01-19 NOTE — Transfer of Care (Signed)
Immediate Anesthesia Transfer of Care Note  Patient: Deborah Maldonado  Procedure(s) Performed: Procedure(s): RIGHT TOTAL HIP ARTHROPLASTY (Right)  Patient Location: PACU  Anesthesia Type:General  Level of Consciousness: awake, alert , oriented and patient cooperative  Airway & Oxygen Therapy: Patient Spontanous Breathing and Patient connected to face mask oxygen  Post-op Assessment: Report given to RN, Post -op Vital signs reviewed and stable, Patient moving all extremities and Patient moving all extremities X 4  Post vital signs: Reviewed and stable  Last Vitals:  Filed Vitals:   01/19/15 1035  BP: 138/93  Pulse: 86  Temp: 36.9 C  Resp: 18    Complications: No apparent anesthesia complications

## 2015-01-19 NOTE — Anesthesia Postprocedure Evaluation (Signed)
  Anesthesia Post-op Note  Patient: Deborah Maldonado  Procedure(s) Performed: Procedure(s): RIGHT TOTAL HIP ARTHROPLASTY (Right)  Patient Location: PACU  Anesthesia Type:General  Level of Consciousness: awake and alert   Airway and Oxygen Therapy: Patient Spontanous Breathing  Post-op Pain: mild  Post-op Assessment: Post-op Vital signs reviewed              Post-op Vital Signs: Reviewed  Last Vitals:  Filed Vitals:   01/19/15 1635  BP: 130/72  Pulse: 81  Temp: 36.4 C  Resp: 12    Complications: No apparent anesthesia complications

## 2015-01-19 NOTE — Anesthesia Preprocedure Evaluation (Addendum)
Anesthesia Evaluation  Patient identified by MRN, date of birth, ID band Patient awake    Reviewed: Allergy & Precautions, NPO status , Patient's Chart, lab work & pertinent test results  Airway Mallampati: II  TM Distance: >3 FB Neck ROM: Full    Dental  (+) Dental Advisory Given   Pulmonary neg pulmonary ROS,    breath sounds clear to auscultation       Cardiovascular hypertension, Pt. on medications  Rhythm:Regular Rate:Normal     Neuro/Psych Depression Bipolar Disorder negative neurological ROS     GI/Hepatic Neg liver ROS, GERD  ,  Endo/Other  Hypothyroidism   Renal/GU negative Renal ROS     Musculoskeletal  (+) Arthritis , Fibromyalgia -  Abdominal   Peds  Hematology negative hematology ROS (+)   Anesthesia Other Findings   Reproductive/Obstetrics                            Lab Results  Component Value Date   WBC 7.4 01/10/2015   HGB 12.7 01/10/2015   HCT 40.0 01/10/2015   MCV 84.0 01/10/2015   PLT 332 01/10/2015   Lab Results  Component Value Date   CREATININE 0.82 01/10/2015   BUN 8 01/10/2015   NA 136 01/10/2015   K 3.2* 01/10/2015   CL 101 01/10/2015   CO2 26 01/10/2015   Lab Results  Component Value Date   INR 1.04 01/10/2015    Anesthesia Physical Anesthesia Plan  ASA: II  Anesthesia Plan: General   Post-op Pain Management:    Induction: Intravenous  Airway Management Planned: Oral ETT  Additional Equipment:   Intra-op Plan:   Post-operative Plan: Extubation in OR  Informed Consent: I have reviewed the patients History and Physical, chart, labs and discussed the procedure including the risks, benefits and alternatives for the proposed anesthesia with the patient or authorized representative who has indicated his/her understanding and acceptance.   Dental advisory given  Plan Discussed with: CRNA  Anesthesia Plan Comments:         Anesthesia Quick Evaluation

## 2015-01-19 NOTE — Interval H&P Note (Signed)
History and Physical Interval Note:  01/19/2015 1:07 PM  Deborah Maldonado  has presented today for surgery, with the diagnosis of RIGHT HIP OSTEOARTHRITIS  The various methods of treatment have been discussed with the patient and family. After consideration of risks, benefits and other options for treatment, the patient has consented to  Procedure(s): TOTAL HIP ARTHROPLASTY (Right) as a surgical intervention .  The patient's history has been reviewed, patient examined, no change in status, stable for surgery.  I have reviewed the patient's chart and labs.  Questions were answered to the patient's satisfaction.     Kerin Salen

## 2015-01-19 NOTE — Anesthesia Procedure Notes (Signed)
Procedure Name: Intubation Date/Time: 01/19/2015 1:41 PM Performed by: Izora Gala Pre-anesthesia Checklist: Patient identified, Emergency Drugs available, Suction available and Patient being monitored Patient Re-evaluated:Patient Re-evaluated prior to inductionOxygen Delivery Method: Circle system utilized Preoxygenation: Pre-oxygenation with 100% oxygen Intubation Type: IV induction Ventilation: Mask ventilation without difficulty Laryngoscope Size: Miller and 3 Grade View: Grade I Tube type: Oral Tube size: 7.0 mm Number of attempts: 1 Airway Equipment and Method: Stylet Placement Confirmation: ETT inserted through vocal cords under direct vision,  positive ETCO2 and breath sounds checked- equal and bilateral Secured at: 22 cm Tube secured with: Tape Dental Injury: Teeth and Oropharynx as per pre-operative assessment

## 2015-01-19 NOTE — Op Note (Signed)
PATIENT ID:      Deborah Maldonado  MRN:     [id number] DOB/AGE:    11-20-1965 / 49 y.o.       OPERATIVE REPORT    DATE OF PROCEDURE:  01/19/2015       PREOPERATIVE DIAGNOSIS:  RIGHT HIP OSTEOARTHRITIS                                                       Estimated body mass index is 39.13 kg/(m^2) as calculated from the following:   Height as of this encounter: 5\' 7"  (1.702 m).   Weight as of this encounter: 113.354 kg (249 lb 14.4 oz).     POSTOPERATIVE DIAGNOSIS:  RIGHT HIP OSTEOARTHRITIS                                                           PROCEDURE:  R total hip arthroplasty using a 48 mm DePuy Pinnacle  Cup, [employer], 10-degree polyethylene liner index superior  and posterior, a +0 32 mm ceramic head, a 18x13x150x42 SROM Stem, 18B Sleeve  SURGEON: Kealani Leckey J    ASSISTANT:   Eric K. Barton Dubois  (present throughout entire procedure and necessary for timely completion of the procedure)  ANESTHESIA: GET  BLOOD LOSS: 400 FLUID REPLACEMENT: 1600 crystalloid Tranexamic Acid: 1gm iv DRAINS: None COMPLICATIONS: None    INDICATIONS FOR PROCEDURE:Patient with end-stage arthritis of the R hip.  X-rays show bone-on-bone arthritic changes, peri chondral cyts. Despite conservative measures with observation, anti-inflammatory medicine, narcotics, use of a cane, has severe unremitting pain and can ambulate only 5 blocks before resting.  Patient desires elective right total hip arthroplasty to decrease pain and increase function. The risks, benefits, and alternatives were discussed at length including but not limited to the risks of infection, bleeding, nerve injury, stiffness, blood clots, the need for revision surgery, cardiopulmonary complications, among others, and they were willing to proceed.Benefits have been discussed. Questions answered.     PROCEDURE IN DETAIL: The patient was identified by armband,  received preoperative IV antibiotics in the holding area at [facility], taken to the operating room , appropriate anesthetic monitors  were attached and general endotracheal anesthesia induced. Foley catheter was inserted. Patient was rolled into the L lateral decubitus position and fixed there with a Stulberg Mark II pelvic clamp and the R lower extremity was then prepped and draped  in the usual sterile fashion from the ankle to the hemipelvis. A time-out  procedure was performed. The skin along the lateral hip and thigh  infiltrated with 10 mL of 0.5% Marcaine and epinephrine solution. We  then made a posterolateral approach to the hip. With a #10 blade, 20 cm  incision through skin and subcutaneous tissue down to the level of the  IT band. Small bleeders were identified and cauterized. IT band cut in  line with skin incision exposing the greater trochanter. A Cobra retractor was placed between the gluteus minimus and the superior hip joint capsule, and a spiked Cobra between the quadratus femoris and the inferior hip joint capsule. This isolated the short  external rotators  and piriformis tendons. These were tagged with a #2 Ethibond  suture and cut off their insertion on the intertrochanteric crest. The posterior  capsule was then developed into an acetabular-based flap from Posterior Superior off of the acetabulum out over the femoral neck and back posterior inferior to the acetabular rim. This flap was tagged with two #2 Ethibond sutures and retracted protecting the sciatic nerve. This exposed the arthritic femoral head and osteophytes. The hip was then flexed and internally rotated, dislocating the femoral head and a standard neck cut performed 1 fingerbreadth above the lesser trochanter.  A spiked Cobra was placed in the cotyloid notch and a Hohmann retractor was then used to lever the femur anteriorly off of the anterior pelvic column. A posterior-inferior wing retractor was placed at the junction of the acetabulum and the ischium completing the  acetabular exposure.We then removed the peripheral osteophytes and labrum from the acetabulum. We then reamed the acetabulum up to 48 mm with basket reamers obtaining good coverage in all quadrants, irrigated out with normal  saline solution and hammered into place a 48 mm pinnacle cup in 45  degrees of abduction and about 20 degrees of anteversion. More  peripheral osteophytes removed, the apex hole eliminator was placed, and a 10-degree liner placed with the  IndexPS. The hip was then flexed and internally rotated exposing the  proximal femur, which was entered with the box cutting chisel, the initiating reamer followed by axial reaming up to 13.66mm full depth, and 14 partial depth. We then conically reamed up to 18. And milled the calcar to 18B. We then placed the trial sleeve, and a trial stem. A trial reduction was then  performed with a +0  32-mm ball on the standard neck and  excellent stability was noted with at 90 of flexion with 70 of  internal rotation and then full extension withexternal rotation. The hip  could not be dislocated in full extension. The knee could easily flex  to about 140 degrees. We also stretched the abductors at this point,  because of the preexisting adductor contractures. All trial components  were then removed. The real sleeve was then hammered into place, through the sleeve we reamed with a 13.5 reamer to ensure that the stem did not become incarcerated. The stem itself was then inserted in 10deg anteversion in relation to the calcar.  At this point, a + 0 32-mm ceramic head was  hammered on the stem. The hip was reduced. We checked our stability  one more time and found to be excellent. The wound was once again  thoroughly irrigated out with normal saline solution pulse lavage. The  capsular flap and short external rotators were repaired back to the  intertrochanteric crest through drill holes with a #2 Ethibond suture.  The IT band was closed with running 1  Vicryl suture. The subcutaneous  tissue with 0 and 2-0 undyed Vicryl suture and the skin with running  3-0 Vivryl SQ suture. Aquacil dessing was applied. The patient was then unclamped, rolled supine, awaken extubated and taken to recovery room without difficulty in stable condition.   Frederik Pear J 01/19/2015, 2:54 PM

## 2015-01-19 NOTE — Discharge Instructions (Signed)
,INSTRUCTIONS AFTER JOINT REPLACEMENT   o Remove items at home which could result in a fall. This includes throw rugs or furniture in walking pathways o ICE to the affected joint every three hours while awake for 30 minutes at a time, for at least the first 3-5 days, and then as needed for pain and swelling.  Continue to use ice for pain and swelling. You may notice swelling that will progress down to the foot and ankle.  This is normal after surgery.  Elevate your leg when you are not up walking on it.   o Continue to use the breathing machine you got in the hospital (incentive spirometer) which will help keep your temperature down.  It is common for your temperature to cycle up and down following surgery, especially at night when you are not up moving around and exerting yourself.  The breathing machine keeps your lungs expanded and your temperature down.   DIET:  As you were doing prior to hospitalization, we recommend a well-balanced diet.  DRESSING / WOUND CARE / SHOWERING  Keep the surgical dressing until follow up.  The dressing is water proof, so you can shower without any extra covering.  IF THE DRESSING FALLS OFF or the wound gets wet inside, change the dressing with sterile gauze.  Please use good hand washing techniques before changing the dressing.  Do not use any lotions or creams on the incision until instructed by your surgeon.    ACTIVITY  o Increase activity slowly as tolerated, but follow the weight bearing instructions below.   o No driving for 6 weeks or until further direction given by your physician.  You cannot drive while taking narcotics.  o No lifting or carrying greater than 10 lbs. until further directed by your surgeon. o Avoid periods of inactivity such as sitting longer than an hour when not asleep. This helps prevent blood clots.  o You may return to work once you are authorized by your doctor.     WEIGHT BEARING   Weight bearing as tolerated with assist  device (walker, cane, etc) as directed, use it as long as suggested by your surgeon or therapist, typically at least 4-6 weeks.   EXERCISES  Results after joint replacement surgery are often greatly improved when you follow the exercise, range of motion and muscle strengthening exercises prescribed by your doctor. Safety measures are also important to protect the joint from further injury. Any time any of these exercises cause you to have increased pain or swelling, decrease what you are doing until you are comfortable again and then slowly increase them. If you have problems or questions, call your caregiver or physical therapist for advice.   Rehabilitation is important following a joint replacement. After just a few days of immobilization, the muscles of the leg can become weakened and shrink (atrophy).  These exercises are designed to build up the tone and strength of the thigh and leg muscles and to improve motion. Often times heat used for twenty to thirty minutes before working out will loosen up your tissues and help with improving the range of motion but do not use heat for the first two weeks following surgery (sometimes heat can increase post-operative swelling).   These exercises can be done on a training (exercise) mat, on the floor, on a table or on a bed. Use whatever works the best and is most comfortable for you.    Use music or television while you are exercising so that  the exercises are a pleasant break in your day. This will make your life better with the exercises acting as a break in your routine that you can look forward to.   Perform all exercises about fifteen times, three times per day or as directed.  You should exercise both the operative leg and the other leg as well. ° °Exercises include: °  °• Quad Sets - Tighten up the muscle on the front of the thigh (Quad) and hold for 5-10 seconds.   °• Straight Leg Raises - With your knee straight (if you were given a brace, keep it on),  lift the leg to 60 degrees, hold for 3 seconds, and slowly lower the leg.  Perform this exercise against resistance later as your leg gets stronger.  °• Leg Slides: Lying on your back, slowly slide your foot toward your buttocks, bending your knee up off the floor (only go as far as is comfortable). Then slowly slide your foot back down until your leg is flat on the floor again.  °• Angel Wings: Lying on your back spread your legs to the side as far apart as you can without causing discomfort.  °• Hamstring Strength:  Lying on your back, push your heel against the floor with your leg straight by tightening up the muscles of your buttocks.  Repeat, but this time bend your knee to a comfortable angle, and push your heel against the floor.  You may put a pillow under the heel to make it more comfortable if necessary.  ° °A rehabilitation program following joint replacement surgery can speed recovery and prevent re-injury in the future due to weakened muscles. Contact your doctor or a physical therapist for more information on knee rehabilitation.  ° ° °CONSTIPATION ° °Constipation is defined medically as fewer than three stools per week and severe constipation as less than one stool per week.  Even if you have a regular bowel pattern at home, your normal regimen is likely to be disrupted due to multiple reasons following surgery.  Combination of anesthesia, postoperative narcotics, change in appetite and fluid intake all can affect your bowels.  ° °YOU MUST use at least one of the following options; they are listed in order of increasing strength to get the job done.  They are all available over the counter, and you may need to use some, POSSIBLY even all of these options:   ° °Drink plenty of fluids (prune juice may be helpful) and high fiber foods °Colace 100 mg by mouth twice a day  °Senokot for constipation as directed and as needed Dulcolax (bisacodyl), take with full glass of water  °Miralax (polyethylene glycol)  once or twice a day as needed. ° °If you have tried all these things and are unable to have a bowel movement in the first 3-4 days after surgery call either your surgeon or your primary doctor.   ° °If you experience loose stools or diarrhea, hold the medications until you stool forms back up.  If your symptoms do not get better within 1 week or if they get worse, check with your doctor.  If you experience "the worst abdominal pain ever" or develop nausea or vomiting, please contact the office immediately for further recommendations for treatment. ° ° °ITCHING:  If you experience itching with your medications, try taking only a single pain pill, or even half a pain pill at a time.  You can also use Benadryl over the counter for itching or also to   help with sleep.  ° °TED HOSE STOCKINGS:  Use stockings on both legs until for at least 2 weeks or as directed by physician office. They may be removed at night for sleeping. ° °MEDICATIONS:  See your medication summary on the “After Visit Summary” that nursing will review with you.  You may have some home medications which will be placed on hold until you complete the course of blood thinner medication.  It is important for you to complete the blood thinner medication as prescribed. ° °PRECAUTIONS:  If you experience chest pain or shortness of breath - call 911 immediately for transfer to the hospital emergency department.  ° °If you develop a fever greater that 101 F, purulent drainage from wound, increased redness or drainage from wound, foul odor from the wound/dressing, or calf pain - CONTACT YOUR SURGEON.   °                                                °FOLLOW-UP APPOINTMENTS:  If you do not already have a post-op appointment, please call the office for an appointment to be seen by your surgeon.  Guidelines for how soon to be seen are listed in your “After Visit Summary”, but are typically between 1-4 weeks after surgery. ° °OTHER INSTRUCTIONS:  ° °Knee  Replacement:  Do not place pillow under knee, focus on keeping the knee straight while resting. CPM instructions: 0-90 degrees, 2 hours in the morning, 2 hours in the afternoon, and 2 hours in the evening. Place foam block, curve side up under heel at all times except when in CPM or when walking.  DO NOT modify, tear, cut, or change the foam block in any way. ° °MAKE SURE YOU:  °• Understand these instructions.  °• Get help right away if you are not doing well or get worse.  ° ° °Thank you for letting us be a part of your medical care team.  It is a privilege we respect greatly.  We hope these instructions will help you stay on track for a fast and full recovery!  ° °

## 2015-01-20 ENCOUNTER — Encounter (HOSPITAL_COMMUNITY): Payer: Self-pay | Admitting: Orthopedic Surgery

## 2015-01-20 LAB — CBC
HCT: 31.1 % — ABNORMAL LOW (ref 36.0–46.0)
Hemoglobin: 9.5 g/dL — ABNORMAL LOW (ref 12.0–15.0)
MCH: 26.5 pg (ref 26.0–34.0)
MCHC: 30.5 g/dL (ref 30.0–36.0)
MCV: 86.9 fL (ref 78.0–100.0)
PLATELETS: 277 10*3/uL (ref 150–400)
RBC: 3.58 MIL/uL — ABNORMAL LOW (ref 3.87–5.11)
RDW: 14.3 % (ref 11.5–15.5)
WBC: 7.4 10*3/uL (ref 4.0–10.5)

## 2015-01-20 MED ORDER — FLUTICASONE PROPIONATE 50 MCG/ACT NA SUSP
1.0000 | Freq: Every day | NASAL | Status: DC
  Administered 2015-01-21: 1 via NASAL
  Filled 2015-01-20: qty 16

## 2015-01-20 NOTE — Progress Notes (Signed)
Utilization review completed.  

## 2015-01-20 NOTE — Progress Notes (Signed)
Patient ID:   PATIENT ID: MUNACHIMSO BELLEVUE  MRN: [id number]  DOB/AGE:  49-01-1966 / 49 y.o.  1 Day Post-Op Procedure(s) (LRB): RIGHT TOTAL HIP ARTHROPLASTY (Right)    PROGRESS NOTE Subjective: Patient is alert, oriented, no Nausea, no Vomiting, yes passing gas, . Taking PO well. Denies SOB, Chest or Calf Pain. Using Incentive Spirometer, PAS in place. Ambulate WBAT Patient reports pain as  3/10  .    Objective: Vital signs in last 24 hours: Filed Vitals:   01/19/15 1620 01/19/15 1635 01/19/15 2012 01/20/15 0521  BP: 139/79 130/72 110/67 111/63  Pulse: 85 81 103 98  Temp:  97.5 F (36.4 C) 98.7 F (37.1 C) 97.6 F (36.4 C)  TempSrc:   Oral Oral  Resp: 13 12 14 17   Height:      Weight:      SpO2: 89% 98% 99% 100%      Intake/Output from previous day: I/O last 3 completed shifts: In: 1200 [I.V.:1200] Out: 1300 [Urine:900; Blood:400]   Intake/Output this shift:     LABORATORY DATA:  Recent Labs  01/20/15 0724  WBC 7.4  HGB 9.5*  HCT 31.1*  PLT 277    Examination: Neurologically intact ABD soft Neurovascular intact Sensation intact distally Intact pulses distally Dorsiflexion/Plantar flexion intact Incision: dressing C/D/I No cellulitis present Compartment soft} XR AP&Lat of hip shows well placed\fixed THA  Assessment:   1 Day Post-Op Procedure(s) (LRB): RIGHT TOTAL HIP ARTHROPLASTY (Right) ADDITIONAL DIAGNOSIS:  Expected Acute Blood Loss Anemia, obesity  Plan: PT/OT WBAT, THA  posterior precautions  DVT Prophylaxis: SCDx72 hrs, ASA 325 mg BID x 2 weeks  DISCHARGE PLAN: Home  DISCHARGE NEEDS: HHPT, Walker and 3-in-1 comode seat

## 2015-01-20 NOTE — Evaluation (Signed)
Occupational Therapy Evaluation Patient Details Name: Deborah Maldonado MRN: [id number] DOB: March 25, 1965 Today's Date: 01/20/2015    History of Present Illness s/p R posterior approach THA.  PMH significant for fibromyalgia, depression and bipolar   Clinical Impression    This 49 year old female was admitted for the above surgery. She will benefit from continued OT in acute and follow up [place] [facility] to increase safety and compliance with posterior THPs during ADLs/bathroom transfers.  Pt needed assistance recently for ADLs due to pain.  At this time, she needs up to max A for LB adls and she needs min A for toilet transfers. Goals in acute are for min guard to min A for bathroom transfers and fewer cues for precautions during adls.    Follow Up Recommendations  Home health OT;Supervision/Assistance - 24 hour    Equipment Recommendations  3 in 1 bedside comode    Recommendations for Other Services       Precautions / Restrictions Precautions Precautions: Posterior Hip Restrictions RLE Weight Bearing: Weight bearing as tolerated      Mobility Bed Mobility Overal bed mobility: Needs Assistance Bed Mobility: Sit to Supine       Sit to supine: Min assist   General bed mobility comments: assist for RLE; cues for technique  Transfers Overall transfer level: Needs assistance Equipment used: Rolling walker (2 wheeled) Transfers: Sit to/from Stand Sit to Stand: Min assist         General transfer comment: cues for precautions, UE/LE placement    Balance                                            ADL Overall ADL's : Needs assistance/impaired     Grooming: Min guard;Standing   Upper Body Bathing: Set up;Sitting   Lower Body Bathing: Minimal assistance;Sit to/from stand;With adaptive equipment   Upper Body Dressing : Set up;Sitting   Lower Body Dressing: Maximal assistance;With adaptive equipment;Sit to/from stand   Toilet Transfer: Minimal  assistance;Ambulation (high commode with grab bar)   Toileting- Clothing Manipulation and Hygiene: Sit to/from stand;Minimal assistance         General ADL Comments: educated on AE and pt practiced with reacher and sock aide. She feels that she will have family assist her.  She has been getting to bottom of tub:  educated that she will not be able to do this.  Will further educated on DME vs sponge bathing.  tub bench would be safest option but pt does have doors on her tub.  Reviewed posterior precautions:  husband in the room. She will need reinforcement with these     Vision     Perception     Praxis      Pertinent Vitals/Pain Pain Assessment: 0-10 Pain Score: 8  Pain Location: R hip Pain Descriptors / Indicators: Aching Pain Intervention(s): Limited activity within patient's tolerance;Monitored during session;Premedicated before session;Repositioned     Hand Dominance     Extremity/Trunk Assessment Upper Extremity Assessment Upper Extremity Assessment:  (has bil tremor; overshot reach for reacher)           Communication Communication Communication: No difficulties   Cognition Arousal/Alertness: Awake/alert Behavior During Therapy: WFL for tasks assessed/performed Overall Cognitive Status: Within Functional Limits for tasks assessed  General Comments       Exercises       Shoulder Instructions      Home Living Family/patient expects to be discharged to:: Private residence Living Arrangements: Spouse/significant other Available Help at Discharge: Family (will have 24/7 initially)               Bathroom Shower/Tub: Tub/shower unit;Door Shower/tub characteristics: Door [employer]: Handicapped height                Prior Functioning/Environment Level of Independence: Needs assistance        Comments: has needed assistance with adls    OT Diagnosis: Acute pain   OT Problem List: Decreased  strength;Decreased activity tolerance;Impaired balance (sitting and/or standing);Decreased knowledge of use of DME or AE;Decreased knowledge of precautions;Pain   OT Treatment/Interventions: Self-care/ADL training;DME and/or AE instruction;Patient/family education;Balance training    OT Goals(Current goals can be found in the care plan section) Acute Rehab OT Goals Patient Stated Goal: decreased pain OT Goal Formulation: With patient Time For Goal Achievement: 01/27/15 Potential to Achieve Goals: Good ADL Goals Pt Will Transfer to Toilet: with min guard assist;ambulating;bedside commode Pt Will Perform Tub/Shower Transfer: Tub transfer;tub bench;ambulating;with min assist (vs verbalize stepping over tub when ready) Additional ADL Goal #1: pt will not need any more than 1 vc for posterior THPS during adls/toilet transfers  OT Frequency: Min 2X/week   Barriers to D/C:            Co-evaluation              End of Session    Activity Tolerance: Patient limited by pain Patient left: in bed;with call bell/phone within reach;with bed alarm set;with family/visitor present   Time: [id number] OT Time Calculation (min): 26 min Charges:  OT General Charges $OT Visit: 1 Procedure OT Evaluation $Initial OT Evaluation Tier I: 1 Procedure OT Treatments $Self Care/Home Management : 8-22 mins G-Codes:    Zohair Epp 2015/01/24, 9:29 AM   Lesle Chris, OTR/L [phone] 2015/01/24

## 2015-01-20 NOTE — Evaluation (Signed)
Physical Therapy Evaluation Patient Details Name: Deborah Maldonado MRN: [id number] DOB: 1966-02-04 Today's Date: 01/20/2015   History of Present Illness  s/p R posterior approach THA.  PMH significant for fibromyalgia, depression and bipolar  Clinical Impression  Pt is s/p posterior THA resulting in the deficits listed below (see PT Problem List). Pt will benefit from skilled PT to increase their independence and safety with mobility to allow discharge to the venue listed below. During initial session the patient was able to ambulate 25 feet with min guard and rw. Will continue to progress mobility and independence in anticipation of D/C to home.      Follow Up Recommendations Home health PT;Supervision for mobility/OOB    Equipment Recommendations  Rolling walker with 5" wheels    Recommendations for Other Services       Precautions / Restrictions Precautions Precautions: Posterior Hip;Fall Precaution Booklet Issued: Yes (comment) Precaution Comments: Precautions reviewed and posted Restrictions Weight Bearing Restrictions: Yes RLE Weight Bearing: Weight bearing as tolerated      Mobility  Bed Mobility Overal bed mobility: Needs Assistance Bed Mobility: Sit to Supine       Sit to supine: Min assist   General bed mobility comments: up with nursing upon arrival  Transfers Overall transfer level: Needs assistance Equipment used: Rolling walker (2 wheeled) Transfers: Sit to/from Stand Sit to Stand: Supervision         General transfer comment: cues for precautions  Ambulation/Gait Ambulation/Gait assistance: Min guard Ambulation Distance (Feet): 25 Feet Assistive device: Rolling walker (2 wheeled) Gait Pattern/deviations: Step-to pattern;Decreased stance time - right;Decreased weight shift to right Gait velocity: decreased   General Gait Details: No loss of balance with ambulation. Cues needed for precautions with turning.   Stairs             Wheelchair Mobility    Modified Rankin (Stroke Patients Only)       Balance Overall balance assessment: Needs assistance Sitting-balance support: No upper extremity supported Sitting balance-Leahy Scale: Good     Standing balance support: Bilateral upper extremity supported Standing balance-Leahy Scale: Poor Standing balance comment: using rw                             Pertinent Vitals/Pain Pain Assessment: 0-10 Pain Score: 5  Pain Location: rt hip Pain Descriptors / Indicators: Aching Pain Intervention(s): Monitored during session;Limited activity within patient's tolerance    Home Living Family/patient expects to be discharged to:: Private residence Living Arrangements: Spouse/significant other Available Help at Discharge: Family;Available 24 hours/day Type of Home: House Home Access: Stairs to enter Entrance Stairs-Rails: None Entrance Stairs-Number of Steps: 1 Home Layout: Able to live on main level with bedroom/bathroom Home Equipment: None      Prior Function Level of Independence: Independent         Comments: no assistive device used with ambulation     Hand Dominance        Extremity/Trunk Assessment   Upper Extremity Assessment: Defer to OT evaluation           Lower Extremity Assessment: RLE deficits/detail RLE Deficits / Details: fair quad activation       Communication   Communication: No difficulties  Cognition Arousal/Alertness: Awake/alert Behavior During Therapy: WFL for tasks assessed/performed Overall Cognitive Status: Within Functional Limits for tasks assessed                      General  Comments      Exercises Total Joint Exercises Ankle Circles/Pumps: AROM;Both;10 reps Quad Sets: Strengthening;Right;10 reps Gluteal Sets: Strengthening;Both;10 reps Heel Slides: AAROM;Right;10 reps;Other (comment) (within precautions) Hip ABduction/ADduction: Strengthening;Right;10 reps;Other (comment) (mod  assist)      Assessment/Plan    PT Assessment Patient needs continued PT services  PT Diagnosis Difficulty walking;Generalized weakness;Acute pain;Abnormality of gait   PT Problem List Decreased strength;Decreased range of motion;Decreased activity tolerance;Decreased balance;Decreased mobility;Decreased knowledge of precautions;Pain  PT Treatment Interventions DME instruction;Gait training;Stair training;Functional mobility training;Therapeutic activities;Therapeutic exercise;Balance training;Patient/family education   PT Goals (Current goals can be found in the Care Plan section) Acute Rehab PT Goals Patient Stated Goal: go home from hospital PT Goal Formulation: With patient Time For Goal Achievement: 02/03/15 Potential to Achieve Goals: Good    Frequency 7X/week   Barriers to discharge        Co-evaluation               End of Session Equipment Utilized During Treatment: Gait belt Activity Tolerance: Patient tolerated treatment well;Patient limited by fatigue Patient left: in chair;with call bell/phone within reach;with family/visitor present Nurse Communication: Mobility status;Precautions;Weight bearing status         Time: 1019-1050 PT Time Calculation (min) (ACUTE ONLY): 31 min   Charges:   PT Evaluation $Initial PT Evaluation Tier I: 1 Procedure PT Treatments $Gait Training: 8-22 mins   PT G Codes:        Cassell Clement, PT, CSCS Pager [phone] Office [phone]  01/20/2015, 11:13 AM

## 2015-01-20 NOTE — Progress Notes (Signed)
Physical Therapy Treatment Patient Details Name: Deborah Maldonado MRN: [id number] DOB: [birth year] Today's Date: 01/20/2015    History of Present Illness s/p R posterior approach THA.  PMH significant for fibromyalgia, depression and bipolar    PT Comments    Patient able to increase her ambulation during second session to 50 feet with min guard and rw. Will continue to progress mobility as tolerated in anticipation of D/C to home with family assistance. Will need to attempt one step prior to D/C.   Follow Up Recommendations  Home health PT;Supervision for mobility/OOB     Equipment Recommendations  Rolling walker with 5" wheels    Recommendations for Other Services       Precautions / Restrictions Precautions Precautions: Posterior Hip;Fall Restrictions Weight Bearing Restrictions: Yes RLE Weight Bearing: Weight bearing as tolerated    Mobility  Bed Mobility Overal bed mobility: Needs Assistance Bed Mobility: Supine to Sit     Supine to sit: Supervision;HOB elevated     General bed mobility comments: Supervision for cues as needed for hip precautions. Using rail to assist.   Transfers Overall transfer level: Needs assistance Equipment used: Rolling walker (2 wheeled) Transfers: Sit to/from Stand Sit to Stand: Supervision         General transfer comment: cues for precautions, reviewing 90 degree flexion  and sitting. Transfers performed from bed and BSC.   Ambulation/Gait Ambulation/Gait assistance: Min guard Ambulation Distance (Feet): 50 Feet Assistive device: Rolling walker (2 wheeled) Gait Pattern/deviations: Step-through pattern;Decreased step length - left;Decreased stance time - right;Decreased weight shift to right Gait velocity: decreased   General Gait Details: Slow pattern but no loss of balance. Intermittant cues as needed for hip precautions with turning.    Stairs            Wheelchair Mobility    Modified Rankin (Stroke Patients  Only)       Balance Overall balance assessment: Needs assistance Sitting-balance support: No upper extremity supported Sitting balance-Leahy Scale: Good     Standing balance support: During functional activity Standing balance-Leahy Scale: Fair Standing balance comment: able to stand at sink without support                    Cognition Arousal/Alertness: Awake/alert Behavior During Therapy: WFL for tasks assessed/performed Overall Cognitive Status: Within Functional Limits for tasks assessed                      Exercises      General Comments        Pertinent Vitals/Pain Pain Assessment: 0-10 Pain Score: 5  Pain Location: rt hip Pain Descriptors / Indicators: Aching Pain Intervention(s): Limited activity within patient's tolerance;Monitored during session    Home Living                      Prior Function            PT Goals (current goals can now be found in the care plan section) Acute Rehab PT Goals Patient Stated Goal: go home from hospital PT Goal Formulation: With patient Time For Goal Achievement: 02/03/15 Potential to Achieve Goals: Good Progress towards PT goals: Progressing toward goals    Frequency  7X/week    PT Plan Current plan remains appropriate    Co-evaluation             End of Session Equipment Utilized During Treatment: Gait belt Activity Tolerance: Patient limited by fatigue;Patient limited by  pain Patient left: in chair;with call bell/phone within reach;with family/visitor present     Time: [id number] PT Time Calculation (min) (ACUTE ONLY): 25 min  Charges:  $Gait Training: 23-37 mins                    G Codes:      Cassell Clement, PT, CSCS Pager [phone] Office [phone]   01/20/2015, 4:03 PM

## 2015-01-21 LAB — CBC
HEMATOCRIT: 29.9 % — AB (ref 36.0–46.0)
HEMOGLOBIN: 9.3 g/dL — AB (ref 12.0–15.0)
MCH: 26.8 pg (ref 26.0–34.0)
MCHC: 31.1 g/dL (ref 30.0–36.0)
MCV: 86.2 fL (ref 78.0–100.0)
Platelets: 278 10*3/uL (ref 150–400)
RBC: 3.47 MIL/uL — AB (ref 3.87–5.11)
RDW: 14.2 % (ref 11.5–15.5)
WBC: 10.9 10*3/uL — ABNORMAL HIGH (ref 4.0–10.5)

## 2015-01-21 NOTE — Progress Notes (Addendum)
Occupational Therapy Treatment Patient Details Name: Deborah Maldonado MRN: [id number] DOB: 1965-10-06 Today's Date: 01/21/2015    History of present illness s/p R posterior approach THA.  PMH significant for fibromyalgia, depression and bipolar   OT comments  Pt making progress toward OT goals. Educated pt and husband on tub bench transfer; pt able to demonstrate understanding with min assist to manage RLE. Pt able to recall 1/3 hip precautions, educated on all precautions. D/c plan remains appropriate at this time. Will continue to follow acutely.   Follow Up Recommendations  Home health OT;Supervision/Assistance - 24 hour    Equipment Recommendations  3 in 1 bedside comode;Tub/shower bench    Recommendations for Other Services      Precautions / Restrictions Precautions Precautions: Posterior Hip;Fall Precaution Comments: Pt able to recall 1/3 precautions. Reviewed all precautions with pt  Restrictions Weight Bearing Restrictions: Yes RLE Weight Bearing: Weight bearing as tolerated       Mobility Bed Mobility Overal bed mobility: Needs Assistance Bed Mobility: Supine to Sit     Supine to sit: Supervision;HOB elevated     General bed mobility comments: Pt OOB in chair  Transfers Overall transfer level: Needs assistance Equipment used: Rolling walker (2 wheeled) Transfers: Sit to/from Stand Sit to Stand: Min guard         General transfer comment: VC for hand placement and maintaining hip precautions    Balance Overall balance assessment: Needs assistance Sitting-balance support: No upper extremity supported Sitting balance-Leahy Scale: Good     Standing balance support: Bilateral upper extremity supported;During functional activity Standing balance-Leahy Scale: Fair Standing balance comment: using rw with ambulation                   ADL Overall ADL's : Needs assistance/impaired                                 Tub/ Shower  Transfer: Minimal assistance;Ambulation;Tub bench;Rolling walker Tub/Shower Transfer Details (indicate cue type and reason): Eduated pt on tub bench transfer technique. Pt required min assist to manage RLE in/out of tub. Pts husband demonstrated ability to assist with RLE.  Functional mobility during ADLs: Min guard;Rolling walker General ADL Comments: Husband present for OT session.      Vision                     Perception     Praxis      Cognition   Behavior During Therapy: WFL for tasks assessed/performed Overall Cognitive Status: Within Functional Limits for tasks assessed       Memory: Decreased recall of precautions               Extremity/Trunk Assessment               Exercises     Shoulder Instructions       General Comments      Pertinent Vitals/ Pain       Pain Assessment: Faces Pain Score: 6  Faces Pain Scale: Hurts whole lot Pain Location: R hip Pain Descriptors / Indicators: Grimacing;Guarding;Sore Pain Intervention(s): Limited activity within patient's tolerance;Monitored during session;Repositioned;Patient requesting pain meds-RN notified;Ice applied  Home Living  Prior Functioning/Environment              Frequency Min 2X/week     Progress Toward Goals  OT Goals(current goals can now be found in the care plan section)  Progress towards OT goals: Progressing toward goals  Acute Rehab OT Goals Patient Stated Goal: go home today OT Goal Formulation: With patient  Plan Discharge plan remains appropriate    Co-evaluation                 End of Session Equipment Utilized During Treatment: Gait belt;Rolling walker   Activity Tolerance Patient limited by pain   Patient Left in chair;with call bell/phone within reach;with nursing/sitter in room   Nurse Communication Patient requests pain meds        Time: [id number] OT Time Calculation (min): 23  min  Charges: OT General Charges $OT Visit: 1 Procedure OT Treatments $Self Care/Home Management : 23-37 mins  Binnie Kand M.S., OTR/L Pager: [phone]  01/21/2015, 10:52 AM

## 2015-01-21 NOTE — Care Management Note (Signed)
Case Management Note  Patient Details  Name: LAJEAN PICCIANO MRN: [id number] Date of Birth: [birth year]  Subjective/Objective:  49 yr old female s/p right total hip arthroplasty.                  Action/Plan:  Case manager spoke with patient at bedside concerning home health and DME needs. Patient was preoperatively setup with [facility], no changes. She will have family assistance at discharge.  Expected Discharge Date:   01/21/15                Expected Discharge Plan:   Home with Home Health  In-House Referral:  NA  Discharge planning Services  CM Consult  Post Acute Care Choice:  Home Health, Durable Medical Equipment Choice offered to:  Patient  DME Arranged:  3-N-1, Walker rolling DME Agency:  [employer]  HH Arranged:  PT HH Agency:  [place]  Status of Service:  Completed, signed off  Medicare Important Message Given:    Date Medicare IM Given:    Medicare IM give by:    Date Additional Medicare IM Given:    Additional Medicare Important Message give by:     If discussed at [place] of Stay Meetings, dates discussed:    Additional Comments:  Ninfa Meeker, RN 01/21/2015, 10:56 AM

## 2015-01-21 NOTE — Discharge Summary (Signed)
Patient ID: Deborah Maldonado MRN: [id number] DOB/AGE: [birth year] 49 y.o.  Admit date: 01/19/2015 Discharge date: 01/21/2015  Admission Diagnoses:  Active Problems:   Primary osteoarthritis of right hip   Discharge Diagnoses:  Same  Past Medical History  Diagnosis Date  . Hypertension     takes Hyzaar daily  . Hypothyroidism     takes Synthroid daily  . Depression     takes Prozac daily  . Seasonal allergies     uses Dymista daily and takes Allegra daily  . Pneumonia 2014  . History of bronchitis early 2016  . Headache     occasionally  . Fibromyalgia     takes Lyrica daily  . Arthritis   . Joint pain   . Back pain     facet joint encapsulated   . GERD (gastroesophageal reflux disease)     takes Dexilant daily  . Slow urinary stream     bladder stem stretched this year  . Bipolar disorder ([place])     takes Lithium daily  . Insomnia     takes Triazolam nightly    Surgeries: Procedure(s): RIGHT TOTAL HIP ARTHROPLASTY on 01/19/2015   Consultants:    Discharged Condition: Improved  Hospital Course: CORVETTA RIEDINGER is an 49 y.o. female who was admitted 01/19/2015 for operative treatment of<principal problem not specified>. Patient has severe unremitting pain that affects sleep, daily activities, and work/hobbies. After pre-op clearance the patient was taken to the operating room on 01/19/2015 and underwent  Procedure(s): RIGHT TOTAL HIP ARTHROPLASTY.    Patient was given perioperative antibiotics: Anti-infectives    Start     Dose/Rate Route Frequency Ordered Stop   01/18/15 1415  vancomycin (VANCOCIN) 1,500 mg in sodium chloride 0.9 % 500 mL IVPB     1,500 mg 250 mL/hr over 120 Minutes Intravenous To [facility] 01/18/15 1403 01/19/15 1315       Patient was given sequential compression devices, early ambulation, and chemoprophylaxis to prevent DVT.  Patient benefited maximally from hospital stay and there were no complications.    Recent vital signs:  Patient Vitals for the past 24 hrs:  BP Temp Temp src Pulse Resp SpO2  01/21/15 0612 (!) 107/55 mmHg 98.4 F (36.9 C) Oral 89 18 98 %  01/20/15 1928 116/62 mmHg 99.8 F (37.7 C) Oral (!) 104 18 98 %  01/20/15 1257 (!) 148/83 mmHg 99.5 F (37.5 C) Oral (!) 114 18 98 %     Recent laboratory studies:  Recent Labs  01/20/15 0724 01/21/15 0329  WBC 7.4 10.9*  HGB 9.5* 9.3*  HCT 31.1* 29.9*  PLT 277 278     Discharge Medications:     Medication List    TAKE these medications        aspirin EC 325 MG tablet  Take 1 tablet (325 mg total) by mouth 2 (two) times daily.     CULTURELLE DIGESTIVE HEALTH PO  Take 1 capsule by mouth daily.     DEXILANT 60 MG capsule  Generic drug:  dexlansoprazole  Take 60 mg by mouth daily.     diclofenac 75 MG EC tablet  Commonly known as:  VOLTAREN  Take 75 mg by mouth 2 (two) times daily.     diphenhydrAMINE 25 MG tablet  Commonly known as:  BENADRYL  Take 50 mg by mouth at bedtime.     DYMISTA 137-50 MCG/ACT Susp  Generic drug:  Azelastine-Fluticasone  Place 1 spray into the nose 2 (two) times daily.  fexofenadine 180 MG tablet  Commonly known as:  ALLEGRA  Take 180 mg by mouth daily.     FLUoxetine 20 MG tablet  Commonly known as:  PROZAC  Take 20 mg by mouth daily.     ibuprofen 800 MG tablet  Commonly known as:  ADVIL,MOTRIN  Take 800 mg by mouth every 8 (eight) hours as needed for moderate pain.     levothyroxine 150 MCG tablet  Commonly known as:  SYNTHROID, LEVOTHROID  Take 150 mcg by mouth daily before breakfast.     lithium carbonate 450 MG CR tablet  Commonly known as:  ESKALITH  Take 450 mg by mouth 4 (four) times daily.     losartan-hydrochlorothiazide 100-25 MG tablet  Commonly known as:  HYZAAR  Take 1 tablet by mouth daily.     methocarbamol 500 MG tablet  Commonly known as:  ROBAXIN  Take 1 tablet (500 mg total) by mouth 2 (two) times daily with a meal.     oxyCODONE-acetaminophen 5-325 MG tablet   Commonly known as:  ROXICET  Take 1 tablet by mouth every 4 (four) hours as needed.     potassium chloride SA 20 MEQ tablet  Commonly known as:  K-DUR,KLOR-CON  Take 40 mEq by mouth 2 (two) times daily.     pregabalin 225 MG capsule  Commonly known as:  LYRICA  Take 225 mg by mouth 2 (two) times daily.     triazolam 0.25 MG tablet  Commonly known as:  HALCION  Take 0.25 mg by mouth at bedtime.     Vitamin D3 5000 UNITS Tabs  Take 1 tablet by mouth daily.        Diagnostic Studies: Dg Chest 2 View  01/10/2015  CLINICAL DATA:  Preop for right total hip replacement. History of hypertension. EXAM: CHEST  2 VIEW COMPARISON:  08/28/2004 FINDINGS: The heart size and mediastinal contours are within normal limits. Both lungs are clear. The visualized skeletal structures are unremarkable. IMPRESSION: No active cardiopulmonary disease. Electronically Signed   By: Markus Daft M.D.   On: 01/10/2015 16:17   Dg Pelvis Portable  01/19/2015  CLINICAL DATA:  Postop right hip arthroplasty.  Initial encounter. EXAM: PORTABLE PELVIS 1-2 VIEWS COMPARISON:  09/02/2014 preoperative radiographs. FINDINGS: 1602 hours. AP portable examination of the pelvis excludes the iliac crests. The patient is status post right total hip arthroplasty. The hardware appears well positioned. No evidence of acute fracture or dislocation. A small amount of gas is present laterally in the proximal right thigh. IMPRESSION: No demonstrated complication following right total hip arthroplasty. Electronically Signed   By: Richardean Sale M.D.   On: 01/19/2015 16:20    Disposition:       Discharge Instructions    Call MD / Call 911    Complete by:  As directed   If you experience chest pain or shortness of breath, CALL 911 and be transported to the hospital emergency room.  If you develope a fever above 101 F, pus (white drainage) or increased drainage or redness at the wound, or calf pain, call your surgeon's office.     Change  dressing    Complete by:  As directed   You may change your dressing on day 5, then change the dressing daily with sterile 4 x 4 inch gauze dressing and paper tape.  You may clean the incision with alcohol prior to redressing     Constipation Prevention    Complete by:  As directed  Drink plenty of fluids.  Prune juice may be helpful.  You may use a stool softener, such as Colace (over the counter) 100 mg twice a day.  Use MiraLax (over the counter) for constipation as needed.     Diet - low sodium heart healthy    Complete by:  As directed      Driving restrictions    Complete by:  As directed   No driving for 2 weeks     Follow the hip precautions as taught in Physical Therapy    Complete by:  As directed      Increase activity slowly as tolerated    Complete by:  As directed      Patient may shower    Complete by:  As directed   You may shower without a dressing once there is no drainage.  Do not wash over the wound.  If drainage remains, cover wound with plastic wrap and then shower.           Follow-up Information    Follow up with Kerin Salen, MD In 2 weeks.   Specialty:  Orthopedic Surgery   Contact information:   [place] [address] [phone]        Signed: Theodosia Quay 01/21/2015, 8:13 AM

## 2015-01-21 NOTE — Progress Notes (Signed)
PATIENT ID: Deborah Maldonado  MRN: [id number]  DOB/AGE:  49-Feb-1967 / 49 y.o.  2 Days Post-Op Procedure(s) (LRB): RIGHT TOTAL HIP ARTHROPLASTY (Right)    PROGRESS NOTE Subjective: Patient is alert, oriented, no Nausea, no Vomiting, yes passing gas, . Taking PO well. Denies SOB, Chest or Calf Pain. Using Incentive Spirometer, PAS in place. Ambulate WBAT Patient reports pain as  5-7/10 with posterior lateral pain .    Objective: Vital signs in last 24 hours: Filed Vitals:   01/20/15 0521 01/20/15 1257 01/20/15 1928 01/21/15 0612  BP: 111/63 148/83 116/62 107/55  Pulse: 98 114 104 89  Temp: 97.6 F (36.4 C) 99.5 F (37.5 C) 99.8 F (37.7 C) 98.4 F (36.9 C)  TempSrc: Oral Oral Oral Oral  Resp: $Remo'17 18 18 18  'xbWmp$ Height:      Weight:      SpO2: 100% 98% 98% 98%      Intake/Output from previous day: I/O last 3 completed shifts: In: 2981.7 [P.O.:890; I.V.:2091.7] Out: 900 [Urine:900]   Intake/Output this shift:     LABORATORY DATA:  Recent Labs  01/20/15 0724 01/21/15 0329  WBC 7.4 10.9*  HGB 9.5* 9.3*  HCT 31.1* 29.9*  PLT 277 278    Examination: Neurologically intact Neurovascular intact Sensation intact distally Intact pulses distally Dorsiflexion/Plantar flexion intact Incision: dressing C/D/I No cellulitis present Compartment soft} XR AP&Lat of hip shows well placed\fixed THA  Assessment:   2 Days Post-Op Procedure(s) (LRB): RIGHT TOTAL HIP ARTHROPLASTY (Right) ADDITIONAL DIAGNOSIS:  Expected Acute Blood Loss Anemia, obesity  Plan: PT/OT WBAT, THA  posterior precautions  DVT Prophylaxis: SCDx72 hrs, ASA 325 mg BID x 2 weeks  DISCHARGE PLAN: Home, after 2nd session of therapy and pt has met therapy goals.  DISCHARGE NEEDS: HHPT, Walker and 3-in-1 comode seat

## 2015-01-21 NOTE — Progress Notes (Signed)
Physical Therapy Treatment Patient Details Name: Deborah Maldonado MRN: [id number] DOB: 1965/10/24 Today's Date: 01/21/2015    History of Present Illness s/p R posterior approach THA.  PMH significant for fibromyalgia, depression and bipolar    PT Comments    Patient is making good progress with PT.  From a mobility standpoint anticipate patient will be ready for DC home with family assistance. Patient denies any questions or concerns following session.   .     Follow Up Recommendations  Home health PT;Supervision for mobility/OOB     Equipment Recommendations  Rolling walker with 5" wheels    Recommendations for Other Services       Precautions / Restrictions Precautions Precautions: Posterior Hip;Fall Precaution Comments: Reviewed all hip precautions, provided handout Restrictions Weight Bearing Restrictions: Yes RLE Weight Bearing: Weight bearing as tolerated    Mobility  Bed Mobility               General bed mobility comments: up/returned to chair  Transfers Overall transfer level: Needs assistance Equipment used: Rolling walker (2 wheeled) Transfers: Sit to/from Stand Sit to Stand: Min guard         General transfer comment: VC for hand placement and maintaining hip precautions  Ambulation/Gait Ambulation/Gait assistance: Min guard Ambulation Distance (Feet): 110 Feet Assistive device: Rolling walker (2 wheeled) Gait Pattern/deviations: Step-through pattern;Decreased weight shift to right Gait velocity: decreased   General Gait Details: working on even strides, reminders for small steps with turns.   Stairs            Wheelchair Mobility    Modified Rankin (Stroke Patients Only)       Balance Overall balance assessment: Needs assistance Sitting-balance support: No upper extremity supported Sitting balance-Leahy Scale: Good     Standing balance support: During functional activity Standing balance-Leahy Scale: Fair Standing  balance comment: using rw                    Cognition Arousal/Alertness: Awake/alert Behavior During Therapy: WFL for tasks assessed/performed Overall Cognitive Status: Within Functional Limits for tasks assessed       Memory: Decreased recall of precautions              Exercises Total Joint Exercises Ankle Circles/Pumps: AROM;Both;10 reps Quad Sets: Strengthening;Right;10 reps Short Arc Quad: Strengthening;Right;10 reps Heel Slides: AAROM;Right;10 reps Hip ABduction/ADduction: Strengthening;Right;10 reps;Other (comment) (mod assist)    General Comments        Pertinent Vitals/Pain Pain Assessment: 0-10 Pain Score: 6  Pain Location: Rt hip Pain Descriptors / Indicators: Sore Pain Intervention(s): Limited activity within patient's tolerance;Monitored during session    Home Living                      Prior Function            PT Goals (current goals can now be found in the care plan section) Acute Rehab PT Goals Patient Stated Goal: go home today PT Goal Formulation: With patient Time For Goal Achievement: 02/03/15 Potential to Achieve Goals: Good Progress towards PT goals: Progressing toward goals    Frequency  7X/week    PT Plan Current plan remains appropriate    Co-evaluation             End of Session Equipment Utilized During Treatment: Gait belt Activity Tolerance: Patient limited by fatigue Patient left: in chair;with call bell/phone within reach;with family/visitor present     Time: [id number] PT Time Calculation (min) (  ACUTE ONLY): 28 min  Charges:  $Gait Training: 8-22 mins $Therapeutic Exercise: 8-22 mins                    G Codes:      Cassell Clement, PT, CSCS Pager [phone] Office [phone]  01/21/2015, 4:13 PM

## 2015-01-21 NOTE — Progress Notes (Signed)
Physical Therapy Treatment Patient Details Name: Deborah Maldonado MRN: [id number] DOB: 1965-06-20 Today's Date: 01/21/2015    History of Present Illness s/p R posterior approach THA.  PMH significant for fibromyalgia, depression and bipolar    PT Comments    Patient making good progress with PT regarding mobility. Able to increase ambulation distance to 100 feet with rw and perform 1 step safely for entering her home. Will follow for second session prior to D/C.   Follow Up Recommendations  Home health PT;Supervision for mobility/OOB     Equipment Recommendations  Rolling walker with 5" wheels    Recommendations for Other Services       Precautions / Restrictions Precautions Precautions: Posterior Hip;Fall Restrictions Weight Bearing Restrictions: Yes RLE Weight Bearing: Weight bearing as tolerated    Mobility  Bed Mobility Overal bed mobility: Needs Assistance Bed Mobility: Supine to Sit     Supine to sit: Supervision;HOB elevated     General bed mobility comments: Patient reports that she does have an adjustable bed at home. No rail used.    Transfers Overall transfer level: Needs assistance Equipment used: Rolling walker (2 wheeled) Transfers: Sit to/from Stand Sit to Stand: Supervision         General transfer comment: cues for hand placement and hip precautions  Ambulation/Gait Ambulation/Gait assistance: Min guard;Supervision Ambulation Distance (Feet): 100 Feet Assistive device: Rolling walker (2 wheeled) Gait Pattern/deviations: Step-through pattern;Decreased stance time - right;Decreased weight shift to right Gait velocity: decreased   General Gait Details: no loss of balance, min guard initially and decreasing to supervison.    Stairs Stairs: Yes Stairs assistance: Min guard Stair Management: No rails;Backwards;With walker Number of Stairs: 1 General stair comments: patient reports feeling confident with getting into home now. Spouse  observing session.   Wheelchair Mobility    Modified Rankin (Stroke Patients Only)       Balance Overall balance assessment: Needs assistance Sitting-balance support: No upper extremity supported Sitting balance-Leahy Scale: Good     Standing balance support: During functional activity Standing balance-Leahy Scale: Fair Standing balance comment: using rw with ambulation                    Cognition Arousal/Alertness: Awake/alert Behavior During Therapy: WFL for tasks assessed/performed Overall Cognitive Status: Within Functional Limits for tasks assessed                      Exercises      General Comments        Pertinent Vitals/Pain Pain Assessment: 0-10 Pain Score: 6  Pain Location: rt hip Pain Descriptors / Indicators: Sore Pain Intervention(s): Limited activity within patient's tolerance;Monitored during session    Home Living                      Prior Function            PT Goals (current goals can now be found in the care plan section) Acute Rehab PT Goals Patient Stated Goal: go home today PT Goal Formulation: With patient Time For Goal Achievement: 02/03/15 Potential to Achieve Goals: Good Progress towards PT goals: Progressing toward goals    Frequency  7X/week    PT Plan Current plan remains appropriate    Co-evaluation             End of Session Equipment Utilized During Treatment: Gait belt Activity Tolerance: Patient limited by fatigue Patient left: in chair;with call bell/phone within reach;with  family/visitor present     Time: [id number] PT Time Calculation (min) (ACUTE ONLY): 25 min  Charges:  $Gait Training: 23-37 mins                    G Codes:      Cassell Clement, PT, CSCS Pager [phone] Office [phone]  01/21/2015, 10:31 AM

## 2015-06-06 DIAGNOSIS — H547 Unspecified visual loss: Secondary | ICD-10-CM | POA: Diagnosis not present

## 2015-06-07 DIAGNOSIS — F3110 Bipolar disorder, current episode manic without psychotic features, unspecified: Secondary | ICD-10-CM | POA: Diagnosis not present

## 2015-06-07 DIAGNOSIS — Z6841 Body Mass Index (BMI) 40.0 and over, adult: Secondary | ICD-10-CM | POA: Diagnosis not present

## 2015-06-07 DIAGNOSIS — M797 Fibromyalgia: Secondary | ICD-10-CM | POA: Diagnosis not present

## 2015-06-07 DIAGNOSIS — M1990 Unspecified osteoarthritis, unspecified site: Secondary | ICD-10-CM | POA: Diagnosis not present

## 2015-06-09 DIAGNOSIS — D531 Other megaloblastic anemias, not elsewhere classified: Secondary | ICD-10-CM | POA: Diagnosis not present

## 2015-06-09 DIAGNOSIS — D509 Iron deficiency anemia, unspecified: Secondary | ICD-10-CM | POA: Diagnosis not present

## 2015-06-22 DIAGNOSIS — R5383 Other fatigue: Secondary | ICD-10-CM | POA: Diagnosis not present

## 2015-06-22 DIAGNOSIS — J301 Allergic rhinitis due to pollen: Secondary | ICD-10-CM | POA: Diagnosis not present

## 2015-06-22 DIAGNOSIS — G4733 Obstructive sleep apnea (adult) (pediatric): Secondary | ICD-10-CM | POA: Diagnosis not present

## 2015-06-22 DIAGNOSIS — R0600 Dyspnea, unspecified: Secondary | ICD-10-CM | POA: Diagnosis not present

## 2015-06-28 DIAGNOSIS — F3174 Bipolar disorder, in full remission, most recent episode manic: Secondary | ICD-10-CM | POA: Diagnosis not present

## 2015-06-28 DIAGNOSIS — F3181 Bipolar II disorder: Secondary | ICD-10-CM | POA: Diagnosis not present

## 2015-06-29 DIAGNOSIS — K5909 Other constipation: Secondary | ICD-10-CM | POA: Diagnosis not present

## 2015-06-29 DIAGNOSIS — Z6841 Body Mass Index (BMI) 40.0 and over, adult: Secondary | ICD-10-CM | POA: Diagnosis not present

## 2015-06-29 DIAGNOSIS — D509 Iron deficiency anemia, unspecified: Secondary | ICD-10-CM | POA: Diagnosis not present

## 2015-07-06 DIAGNOSIS — N958 Other specified menopausal and perimenopausal disorders: Secondary | ICD-10-CM | POA: Diagnosis not present

## 2015-07-06 DIAGNOSIS — G4733 Obstructive sleep apnea (adult) (pediatric): Secondary | ICD-10-CM | POA: Diagnosis not present

## 2015-07-11 ENCOUNTER — Encounter (HOSPITAL_COMMUNITY): Payer: Self-pay

## 2015-07-11 ENCOUNTER — Inpatient Hospital Stay (HOSPITAL_COMMUNITY): Payer: BLUE CROSS/BLUE SHIELD

## 2015-07-11 ENCOUNTER — Inpatient Hospital Stay (HOSPITAL_COMMUNITY): Payer: BLUE CROSS/BLUE SHIELD | Admitting: Anesthesiology

## 2015-07-11 ENCOUNTER — Encounter (HOSPITAL_COMMUNITY)
Admission: EM | Disposition: A | Discharge status: Home / Self Care | Payer: Self-pay | Source: Home / Self Care | Attending: Emergency Medicine

## 2015-07-11 ENCOUNTER — Emergency Department (HOSPITAL_COMMUNITY): Payer: BLUE CROSS/BLUE SHIELD

## 2015-07-11 ENCOUNTER — Observation Stay (HOSPITAL_COMMUNITY)
Admission: EM | Admit: 2015-07-11 | Discharge: 2015-07-12 | DRG: 561 | Disposition: A | Discharge status: Home / Self Care | Payer: BLUE CROSS/BLUE SHIELD | Attending: Orthopaedic Surgery | Admitting: Orthopaedic Surgery

## 2015-07-11 DIAGNOSIS — Y92014 Private driveway to single-family (private) house as the place of occurrence of the external cause: Secondary | ICD-10-CM | POA: Diagnosis not present

## 2015-07-11 DIAGNOSIS — Z79899 Other long term (current) drug therapy: Secondary | ICD-10-CM | POA: Insufficient documentation

## 2015-07-11 DIAGNOSIS — T84020A Dislocation of internal right hip prosthesis, initial encounter: Secondary | ICD-10-CM | POA: Diagnosis not present

## 2015-07-11 DIAGNOSIS — Y831 Surgical operation with implant of artificial internal device as the cause of abnormal reaction of the patient, or of later complication, without mention of misadventure at the time of the procedure: Secondary | ICD-10-CM | POA: Insufficient documentation

## 2015-07-11 DIAGNOSIS — Z96641 Presence of right artificial hip joint: Secondary | ICD-10-CM | POA: Diagnosis not present

## 2015-07-11 DIAGNOSIS — S73004A Unspecified dislocation of right hip, initial encounter: Secondary | ICD-10-CM | POA: Diagnosis present

## 2015-07-11 DIAGNOSIS — I10 Essential (primary) hypertension: Secondary | ICD-10-CM | POA: Diagnosis not present

## 2015-07-11 DIAGNOSIS — E039 Hypothyroidism, unspecified: Secondary | ICD-10-CM | POA: Diagnosis not present

## 2015-07-11 DIAGNOSIS — K219 Gastro-esophageal reflux disease without esophagitis: Secondary | ICD-10-CM | POA: Diagnosis not present

## 2015-07-11 DIAGNOSIS — G4700 Insomnia, unspecified: Secondary | ICD-10-CM | POA: Diagnosis not present

## 2015-07-11 DIAGNOSIS — R52 Pain, unspecified: Secondary | ICD-10-CM | POA: Diagnosis not present

## 2015-07-11 DIAGNOSIS — M549 Dorsalgia, unspecified: Secondary | ICD-10-CM | POA: Diagnosis not present

## 2015-07-11 DIAGNOSIS — M797 Fibromyalgia: Secondary | ICD-10-CM | POA: Diagnosis not present

## 2015-07-11 DIAGNOSIS — Z7982 Long term (current) use of aspirin: Secondary | ICD-10-CM | POA: Diagnosis not present

## 2015-07-11 DIAGNOSIS — F319 Bipolar disorder, unspecified: Secondary | ICD-10-CM | POA: Insufficient documentation

## 2015-07-11 DIAGNOSIS — Z880 Allergy status to penicillin: Secondary | ICD-10-CM | POA: Diagnosis not present

## 2015-07-11 DIAGNOSIS — Z91048 Other nonmedicinal substance allergy status: Secondary | ICD-10-CM | POA: Insufficient documentation

## 2015-07-11 DIAGNOSIS — M25551 Pain in right hip: Secondary | ICD-10-CM | POA: Diagnosis not present

## 2015-07-11 DIAGNOSIS — Z419 Encounter for procedure for purposes other than remedying health state, unspecified: Secondary | ICD-10-CM

## 2015-07-11 DIAGNOSIS — M1990 Unspecified osteoarthritis, unspecified site: Secondary | ICD-10-CM | POA: Diagnosis not present

## 2015-07-11 HISTORY — PX: HIP CLOSED REDUCTION: SHX983

## 2015-07-11 SURGERY — CLOSED REDUCTION, HIP
Anesthesia: General | Laterality: Right

## 2015-07-11 MED ORDER — METOCLOPRAMIDE HCL 5 MG/ML IJ SOLN: 5.0000 mg | Freq: Three times a day (TID) | INTRAMUSCULAR | Status: DC | PRN

## 2015-07-11 MED ORDER — ACETAMINOPHEN 325 MG PO TABS: 650.0000 mg | ORAL_TABLET | Freq: Four times a day (QID) | ORAL | Status: DC | PRN

## 2015-07-11 MED ORDER — LACTATED RINGERS IV SOLN
INTRAVENOUS | Status: DC
  Administered 2015-07-12: via INTRAVENOUS

## 2015-07-11 MED ORDER — PANTOPRAZOLE SODIUM 40 MG PO TBEC
40.0000 mg | DELAYED_RELEASE_TABLET | Freq: Every day | ORAL | Status: DC
  Administered 2015-07-12 (×2): 40 mg via ORAL
  Filled 2015-07-11 (×2): qty 1

## 2015-07-11 MED ORDER — LEVOTHYROXINE SODIUM 75 MCG PO TABS
150.0000 ug | ORAL_TABLET | Freq: Every day | ORAL | Status: DC
  Administered 2015-07-12: 150 ug via ORAL
  Filled 2015-07-11: qty 2
  Filled 2015-07-11: qty 1

## 2015-07-11 MED ORDER — TRAZODONE HCL 100 MG PO TABS
100.0000 mg | ORAL_TABLET | Freq: Every day | ORAL | Status: DC
  Administered 2015-07-12: 100 mg via ORAL
  Filled 2015-07-11 (×2): qty 1

## 2015-07-11 MED ORDER — SODIUM CHLORIDE 0.9 % IV SOLN
INTRAVENOUS | Status: DC | PRN
Start: 2015-07-11 — End: 2015-07-11
  Administered 2015-07-11: 22:00:00 via INTRAVENOUS

## 2015-07-11 MED ORDER — LOSARTAN POTASSIUM-HCTZ 100-25 MG PO TABS: 1.0000 | ORAL_TABLET | Freq: Every day | ORAL | Status: DC

## 2015-07-11 MED ORDER — METOCLOPRAMIDE HCL 5 MG PO TABS: 5.0000 mg | ORAL_TABLET | Freq: Three times a day (TID) | ORAL | Status: DC | PRN

## 2015-07-11 MED ORDER — OXYCODONE HCL 5 MG/5ML PO SOLN
5.0000 mg | Freq: Once | ORAL | Status: DC | PRN
  Filled 2015-07-11: qty 5

## 2015-07-11 MED ORDER — ONDANSETRON HCL 4 MG PO TABS: 4.0000 mg | ORAL_TABLET | Freq: Four times a day (QID) | ORAL | Status: DC | PRN

## 2015-07-11 MED ORDER — PROPOFOL 10 MG/ML IV BOLUS
INTRAVENOUS | Status: DC | PRN
  Administered 2015-07-11: 200 mg via INTRAVENOUS

## 2015-07-11 MED ORDER — FENTANYL CITRATE (PF) 100 MCG/2ML IJ SOLN: 25.0000 ug | INTRAMUSCULAR | Status: DC | PRN

## 2015-07-11 MED ORDER — HYDROMORPHONE HCL 1 MG/ML IJ SOLN: 0.5000 mg | INTRAMUSCULAR | Status: DC | PRN

## 2015-07-11 MED ORDER — PROPOFOL 10 MG/ML IV BOLUS
INTRAVENOUS | Status: AC
  Filled 2015-07-11: qty 20

## 2015-07-11 MED ORDER — OXYCODONE HCL 5 MG PO TABS
5.0000 mg | ORAL_TABLET | ORAL | Status: DC | PRN
  Administered 2015-07-12: 10 mg via ORAL
  Filled 2015-07-11: qty 2

## 2015-07-11 MED ORDER — LOSARTAN POTASSIUM 50 MG PO TABS
100.0000 mg | ORAL_TABLET | Freq: Every day | ORAL | Status: DC
  Administered 2015-07-12: 100 mg via ORAL
  Filled 2015-07-11: qty 2

## 2015-07-11 MED ORDER — METHOCARBAMOL 500 MG PO TABS
500.0000 mg | ORAL_TABLET | Freq: Four times a day (QID) | ORAL | Status: DC | PRN
  Administered 2015-07-12: 500 mg via ORAL
  Filled 2015-07-11: qty 1

## 2015-07-11 MED ORDER — FUROSEMIDE 40 MG PO TABS
40.0000 mg | ORAL_TABLET | Freq: Every morning | ORAL | Status: DC
  Administered 2015-07-12: 40 mg via ORAL
  Filled 2015-07-11: qty 1

## 2015-07-11 MED ORDER — FENTANYL CITRATE (PF) 250 MCG/5ML IJ SOLN
INTRAMUSCULAR | Status: AC
  Filled 2015-07-11: qty 5

## 2015-07-11 MED ORDER — ONDANSETRON HCL 4 MG/2ML IJ SOLN: 4.0000 mg | Freq: Four times a day (QID) | INTRAMUSCULAR | Status: DC | PRN

## 2015-07-11 MED ORDER — OXYCODONE HCL 5 MG PO TABS: 5.0000 mg | ORAL_TABLET | Freq: Once | ORAL | Status: DC | PRN

## 2015-07-11 MED ORDER — ACETAMINOPHEN 650 MG RE SUPP: 650.0000 mg | Freq: Four times a day (QID) | RECTAL | Status: DC | PRN

## 2015-07-11 MED ORDER — PREGABALIN 75 MG PO CAPS
225.0000 mg | ORAL_CAPSULE | Freq: Two times a day (BID) | ORAL | Status: DC
  Administered 2015-07-12 (×2): 225 mg via ORAL
  Filled 2015-07-11 (×2): qty 3

## 2015-07-11 MED ORDER — METOCLOPRAMIDE HCL 10 MG PO TABS: 5.0000 mg | ORAL_TABLET | Freq: Three times a day (TID) | ORAL | Status: DC | PRN

## 2015-07-11 MED ORDER — LITHIUM CARBONATE ER 450 MG PO TBCR
450.0000 mg | EXTENDED_RELEASE_TABLET | Freq: Four times a day (QID) | ORAL | Status: DC
  Administered 2015-07-12 (×2): 450 mg via ORAL
  Filled 2015-07-11 (×5): qty 1

## 2015-07-11 MED ORDER — HYDROMORPHONE HCL 1 MG/ML IJ SOLN
1.0000 mg | Freq: Once | INTRAMUSCULAR | Status: AC
  Administered 2015-07-11: 1 mg via INTRAVENOUS
  Filled 2015-07-11: qty 1

## 2015-07-11 MED ORDER — DOCUSATE SODIUM 100 MG PO CAPS
100.0000 mg | ORAL_CAPSULE | Freq: Two times a day (BID) | ORAL | Status: DC
  Administered 2015-07-12 (×2): 100 mg via ORAL
  Filled 2015-07-11 (×2): qty 1

## 2015-07-11 MED ORDER — POTASSIUM CHLORIDE CRYS ER 20 MEQ PO TBCR
40.0000 meq | EXTENDED_RELEASE_TABLET | Freq: Two times a day (BID) | ORAL | Status: DC
  Administered 2015-07-12 (×2): 40 meq via ORAL
  Filled 2015-07-11 (×2): qty 2

## 2015-07-11 MED ORDER — ACETAMINOPHEN 325 MG PO TABS
650.0000 mg | ORAL_TABLET | Freq: Four times a day (QID) | ORAL | Status: DC | PRN
Start: 2015-07-11 — End: 2015-07-12
  Administered 2015-07-12: 650 mg via ORAL
  Filled 2015-07-11: qty 2

## 2015-07-11 MED ORDER — LIDOCAINE HCL (CARDIAC) 20 MG/ML IV SOLN
INTRAVENOUS | Status: DC | PRN
  Administered 2015-07-11: 60 mg via INTRAVENOUS

## 2015-07-11 MED ORDER — DIPHENHYDRAMINE HCL 12.5 MG/5ML PO ELIX: 12.5000 mg | ORAL_SOLUTION | ORAL | Status: DC | PRN

## 2015-07-11 MED ORDER — QUETIAPINE FUMARATE 100 MG PO TABS
200.0000 mg | ORAL_TABLET | Freq: Every day | ORAL | Status: DC
  Administered 2015-07-12: 200 mg via ORAL
  Filled 2015-07-11: qty 2

## 2015-07-11 MED ORDER — METHOCARBAMOL 1000 MG/10ML IJ SOLN: 500.0000 mg | Freq: Four times a day (QID) | INTRAVENOUS | Status: DC | PRN

## 2015-07-11 MED ORDER — MIDAZOLAM HCL 2 MG/2ML IJ SOLN
INTRAMUSCULAR | Status: AC
  Filled 2015-07-11: qty 2

## 2015-07-11 MED ORDER — HYDROCHLOROTHIAZIDE 25 MG PO TABS
25.0000 mg | ORAL_TABLET | Freq: Every day | ORAL | Status: DC
  Administered 2015-07-12: 25 mg via ORAL
  Filled 2015-07-11: qty 1

## 2015-07-11 SURGICAL SUPPLY — 1 items: IMMOBILIZER KNEE 22 (SOFTGOODS) ×1 IMPLANT

## 2015-07-11 NOTE — Interval H&P Note (Signed)
OK for surgery PD 

## 2015-07-11 NOTE — Transfer of Care (Signed)
Immediate Anesthesia Transfer of Care Note  Patient: Deborah Maldonado  Procedure(s) Performed: Procedure(s): CLOSED REDUCTION HIP (Right)  Patient Location: PACU  Anesthesia Type:General  Level of Consciousness: responds to stimulation  Airway & Oxygen Therapy: Patient Spontanous Breathing and Patient connected to face mask oxygen  Post-op Assessment: Report given to RN and Post -op Vital signs reviewed and stable  Post vital signs: Reviewed and stable  Last Vitals:  Filed Vitals:   07/11/15 2015 07/11/15 2030  BP: 113/77 117/43  Pulse: 85 91  Resp: 17 13    Last Pain:  Filed Vitals:   07/11/15 2247  PainSc: 8          Complications: No apparent anesthesia complications

## 2015-07-11 NOTE — H&P (Signed)
Melrose Nakayama, MD  Chauncey Cruel, PA-C  Loni Dolly, PA-C                                  [facility]                [address] [place],   [address]   ORTHOPAEDIC CONSULTATION  SHUNDRA BRENT            MRN:  [id number] DOB/SEX:  06-20-1965/female     CHIEF COMPLAINT:  Painful right THR  HISTORY: Deborah Maldonado a 50 y.o. female with second time dislocation of right THR.  Was leaning over and felt it slip out.  First postop dislocation was in January.   PAST MEDICAL HISTORY: Patient Active Problem List   Diagnosis Date Noted  . Primary osteoarthritis of right hip 01/19/2015   Past Medical History  Diagnosis Date  . Hypertension     takes Hyzaar daily  . Hypothyroidism     takes Synthroid daily  . Depression     takes Prozac daily  . Seasonal allergies     uses Dymista daily and takes Allegra daily  . Pneumonia 2014  . History of bronchitis early 2016  . Headache     occasionally  . Fibromyalgia     takes Lyrica daily  . Arthritis   . Joint pain   . Back pain     facet joint encapsulated   . GERD (gastroesophageal reflux disease)     takes Dexilant daily  . Slow urinary stream     bladder stem stretched this year  . Bipolar disorder ([place])     takes Lithium daily  . Insomnia     takes Triazolam nightly   Past Surgical History  Procedure Laterality Date  . Eye surgery    . Cholecystectomy    . Appendectomy    . Abdominal hysterectomy    . Tubal ligation    . Total hip arthroplasty Right 01/19/2015    Procedure: RIGHT TOTAL HIP ARTHROPLASTY;  Surgeon: Frederik Pear, MD;  Location: [facility];  Service: Orthopedics;  Laterality: Right;     MEDICATIONS:  No current facility-administered medications for this encounter.  Current outpatient prescriptions:  .  Azelastine-Fluticasone (DYMISTA) 137-50 MCG/ACT SUSP, Place 1 spray into the nose 2 (two) times daily., Disp: , Rfl:  .  dexlansoprazole (DEXILANT) 60 MG capsule, Take 60 mg by mouth  daily., Disp: , Rfl:  .  diclofenac (VOLTAREN) 75 MG EC tablet, Take 75 mg by mouth 2 (two) times daily., Disp: , Rfl:  .  furosemide (LASIX) 40 MG tablet, Take 40 mg by mouth every morning., Disp: , Rfl: 0 .  ibuprofen (ADVIL,MOTRIN) 800 MG tablet, Take 800 mg by mouth every 8 (eight) hours as needed for moderate pain., Disp: , Rfl:  .  Lactobacillus-Inulin (CULTURELLE DIGESTIVE HEALTH PO), Take 1 capsule by mouth daily., Disp: , Rfl:  .  levothyroxine (SYNTHROID, LEVOTHROID) 150 MCG tablet, Take 150 mcg by mouth daily before breakfast., Disp: , Rfl:  .  lithium carbonate (ESKALITH) 450 MG CR tablet, Take 450 mg by mouth 4 (four) times daily., Disp: , Rfl:  .  losartan-hydrochlorothiazide (HYZAAR) 100-25 MG tablet, Take 1 tablet by mouth daily., Disp: , Rfl:  .  potassium chloride SA (K-DUR,KLOR-CON) 20 MEQ tablet, Take 40 mEq by mouth 2 (two) times daily., Disp: , Rfl:  .  pregabalin (LYRICA) 225  MG capsule, Take 225 mg by mouth 2 (two) times daily., Disp: , Rfl:  .  QUEtiapine (SEROQUEL) 400 MG tablet, Take 200 mg by mouth at bedtime. , Disp: , Rfl: 1 .  traZODone (DESYREL) 100 MG tablet, Take 1 tablet by mouth daily., Disp: , Rfl: 1 .  aspirin EC 325 MG tablet, Take 1 tablet (325 mg total) by mouth 2 (two) times daily., Disp: 30 tablet, Rfl: 0 .  methocarbamol (ROBAXIN) 500 MG tablet, Take 1 tablet (500 mg total) by mouth 2 (two) times daily with a meal., Disp: 60 tablet, Rfl: 0 .  oxyCODONE-acetaminophen (ROXICET) 5-325 MG tablet, Take 1 tablet by mouth every 4 (four) hours as needed., Disp: 60 tablet, Rfl: 0  ALLERGIES:   Allergies  Allergen Reactions  . Penicillins Anaphylaxis  . Morphine And Related     Bad headache    REVIEW OF SYSTEMS: REVIEWED IN DETAIL IN CHART  FAMILY HISTORY:  No family history on file.  SOCIAL HISTORY:   Social History  Substance Use Topics  . Smoking status: Never Smoker   . Smokeless tobacco: Not on file  . Alcohol Use: No     EXAMINATION: Vital  signs in last 24 hours: Pulse Rate:  [84-91] 91 (05/08 2030) Resp:  [11-22] 13 (05/08 2030) BP: (113-128)/(43-77) 117/43 mmHg (05/08 2030) SpO2:  [88 %-98 %] 97 % (05/08 2030)  BP 117/43 mmHg  Pulse 91  Resp 13  SpO2 97%  General Appearance:    Alert, cooperative, no distress, appears stated age  Head:    Normocephalic, without obvious abnormality, atraumatic  Eyes:    PERRL, conjunctiva/corneas clear, EOM's intact, fundi    benign, both eyes  Ears:    Normal TM's and external ear canals, both ears  Nose:   Nares normal, septum midline, mucosa normal, no drainage    or sinus tenderness  Throat:   Lips, mucosa, and tongue normal; teeth and gums normal  Neck:   Supple, symmetrical, trachea midline, no adenopathy;    thyroid:  no enlargement/tenderness/nodules; no carotid   bruit or JVD  Back:     Symmetric, no curvature, ROM normal, no CVA tenderness  Lungs:     Clear to auscultation bilaterally, respirations unlabored  Chest Wall:    No tenderness or deformity   Heart:    Regular rate and rhythm, S1 and S2 normal, no murmur, rub   or gallop  Breast Exam:    No tenderness, masses, or nipple abnormality  Abdomen:     Soft, non-tender, bowel sounds active all four quadrants,    no masses, no organomegaly  Genitalia:    Rectal:    Extremities:   Extremities normal, atraumatic, no cyanosis or edema  Pulses:   2+ and symmetric all extremities  Skin:   Skin color, texture, turgor normal, no rashes or lesions  Lymph nodes:   Cervical, supraclavicular, and axillary nodes normal  Neurologic:   CNII-XII intact, normal strength, sensation and reflexes    throughout    Musculoskeletal Exam:   Right leg is SER   DIAGNOSTIC STUDIES: Recent laboratory studies: No results for input(s): WBC, HGB, HCT, PLT in the last 168 hours. No results for input(s): NA, K, CL, CO2, BUN, CREATININE, GLUCOSE, CALCIUM in the last 168 hours. Lab Results  Component Value Date   INR 1.04 01/10/2015      Recent Radiographic Studies :  Dg Hip Port Unilat With Pelvis 1v Right  07/11/2015  CLINICAL DATA:  Peggye Form  over and felt right hip popped today. History of dislocations. EXAM: DG HIP (WITH OR WITHOUT PELVIS) 1V PORT RIGHT COMPARISON:  None. FINDINGS: There is dislocation of the right hip replacement. Cross-table lateral views are nondiagnostic. No fracture visualized. IMPRESSION: Dislocation of the right hip replacement.  No visualized fracture. Electronically Signed   By: Rolm Baptise M.D.   On: 07/11/2015 19:20    ASSESSMENT:  Dislocated right THR   PLAN:  Needs repeat closed reduction.  Will then admit for Mayer Camel to see in AM re bracing.  Ersel Wadleigh G 07/11/2015, 9:14 PM

## 2015-07-11 NOTE — Anesthesia Preprocedure Evaluation (Signed)
Anesthesia Evaluation  Patient identified by MRN, date of birth, ID band Patient awake    Reviewed: Allergy & Precautions, NPO status , Patient's Chart, lab work & pertinent test results  Airway Mallampati: II   Neck ROM: full    Dental   Pulmonary    breath sounds clear to auscultation       Cardiovascular hypertension,  Rhythm:regular Rate:Normal     Neuro/Psych  Headaches, Depression Bipolar Disorder  Neuromuscular disease    GI/Hepatic GERD  ,  Endo/Other  Hypothyroidism   Renal/GU      Musculoskeletal  (+) Arthritis , Fibromyalgia -  Abdominal   Peds  Hematology   Anesthesia Other Findings   Reproductive/Obstetrics                             Anesthesia Physical Anesthesia Plan  ASA: II  Anesthesia Plan: General   Post-op Pain Management:    Induction: Intravenous  Airway Management Planned: Mask  Additional Equipment:   Intra-op Plan:   Post-operative Plan:   Informed Consent: I have reviewed the patients History and Physical, chart, labs and discussed the procedure including the risks, benefits and alternatives for the proposed anesthesia with the patient or authorized representative who has indicated his/her understanding and acceptance.     Plan Discussed with: CRNA, Anesthesiologist and Surgeon  Anesthesia Plan Comments:         Anesthesia Quick Evaluation

## 2015-07-11 NOTE — ED Provider Notes (Signed)
CSN: [id number]     Arrival date & time 07/11/15  1839 History   First MD Initiated Contact with Patient 07/11/15 1840     Chief Complaint  Patient presents with  . Hip Pain  PT HAS A HX OF A TOTAL RIGHT HIP ARTHROPLASTY ON November OF 2016.  IT HAS DISLOCATED SINCE THEN BACK IN January.  THE PT SAID THAT SHE SEES DR. Mayer Camel FOR ORTHO.  THE PT SAID THAT SHE BENT OVER TO FEED THE CAT AND FELT HER RIGHT HIP "GO OUT."  THE PT WAS GIVEN A TOTAL OF 14 MG OF MORPHINE BY EMS EN ROUTE.  SHE STILL C/O SEVERE PAIN.   (Consider location/radiation/quality/duration/timing/severity/associated sxs/prior Treatment) Patient is a 50 y.o. female presenting with hip pain. The history is provided by the patient and the EMS personnel.  Hip Pain This is a recurrent problem. The current episode started less than 1 hour ago.    Past Medical History  Diagnosis Date  . Hypertension     takes Hyzaar daily  . Hypothyroidism     takes Synthroid daily  . Depression     takes Prozac daily  . Seasonal allergies     uses Dymista daily and takes Allegra daily  . Pneumonia 2014  . History of bronchitis early 2016  . Headache     occasionally  . Fibromyalgia     takes Lyrica daily  . Arthritis   . Joint pain   . Back pain     facet joint encapsulated   . GERD (gastroesophageal reflux disease)     takes Dexilant daily  . Slow urinary stream     bladder stem stretched this year  . Bipolar disorder ([place])     takes Lithium daily  . Insomnia     takes Triazolam nightly   Past Surgical History  Procedure Laterality Date  . Eye surgery    . Cholecystectomy    . Appendectomy    . Abdominal hysterectomy    . Tubal ligation    . Total hip arthroplasty Right 01/19/2015    Procedure: RIGHT TOTAL HIP ARTHROPLASTY;  Surgeon: Frederik Pear, MD;  Location: [facility];  Service: Orthopedics;  Laterality: Right;   History reviewed. No pertinent family history. Social History  Substance Use Topics  . Smoking status: Never  Smoker   . Smokeless tobacco: None  . Alcohol Use: No   OB History    No data available     Review of Systems  Musculoskeletal:       RIGHT HIP PAIN  All other systems reviewed and are negative.     Allergies  Penicillins and Morphine and related  Home Medications   Prior to Admission medications   Medication Sig Start Date End Date Taking? Authorizing Provider  Azelastine-Fluticasone (DYMISTA) 137-50 MCG/ACT SUSP Place 1 spray into the nose 2 (two) times daily.   Yes Historical Provider, MD  dexlansoprazole (DEXILANT) 60 MG capsule Take 60 mg by mouth daily.   Yes Historical Provider, MD  diclofenac (VOLTAREN) 75 MG EC tablet Take 75 mg by mouth 2 (two) times daily.   Yes Historical Provider, MD  furosemide (LASIX) 40 MG tablet Take 40 mg by mouth every morning. 06/29/15  Yes Historical Provider, MD  ibuprofen (ADVIL,MOTRIN) 800 MG tablet Take 800 mg by mouth every 8 (eight) hours as needed for moderate pain.   Yes Historical Provider, MD  Lactobacillus-Inulin ([place] PO) Take 1 capsule by mouth daily.   Yes Historical  Provider, MD  levothyroxine (SYNTHROID, LEVOTHROID) 150 MCG tablet Take 150 mcg by mouth daily before breakfast.   Yes Historical Provider, MD  lithium carbonate (ESKALITH) 450 MG CR tablet Take 450 mg by mouth 4 (four) times daily.   Yes Historical Provider, MD  losartan-hydrochlorothiazide (HYZAAR) 100-25 MG tablet Take 1 tablet by mouth daily.   Yes Historical Provider, MD  potassium chloride SA (K-DUR,KLOR-CON) 20 MEQ tablet Take 40 mEq by mouth 2 (two) times daily.   Yes Historical Provider, MD  pregabalin (LYRICA) 225 MG capsule Take 225 mg by mouth 2 (two) times daily.   Yes Historical Provider, MD  QUEtiapine (SEROQUEL) 400 MG tablet Take 200 mg by mouth at bedtime.  06/07/15  Yes Historical Provider, MD  traZODone (DESYREL) 100 MG tablet Take 1 tablet by mouth daily. 06/29/15  Yes Historical Provider, MD  aspirin EC 325 MG tablet Take 1  tablet (325 mg total) by mouth 2 (two) times daily. 01/19/15   Leighton Parody, PA-C  methocarbamol (ROBAXIN) 500 MG tablet Take 1 tablet (500 mg total) by mouth 2 (two) times daily with a meal. 01/19/15   Leighton Parody, PA-C  oxyCODONE-acetaminophen (ROXICET) 5-325 MG tablet Take 1 tablet by mouth every 4 (four) hours as needed. 01/19/15   Leighton Parody, PA-C   BP 120/73 mmHg  Pulse 91  Temp(Src) 97.9 F (36.6 C)  Resp 13  SpO2 97% Physical Exam  Constitutional: She is oriented to person, place, and time. She appears well-developed and well-nourished.  HENT:  Head: Normocephalic and atraumatic.  Right Ear: External ear normal.  Left Ear: External ear normal.  Mouth/Throat: Oropharynx is clear and moist.  Eyes: Conjunctivae are normal. Pupils are equal, round, and reactive to light.  Neck: Normal range of motion. Neck supple.  Cardiovascular: Normal rate, regular rhythm, normal heart sounds and intact distal pulses.   Pulmonary/Chest: Effort normal and breath sounds normal.  Abdominal: Soft. Bowel sounds are normal.  Musculoskeletal: She exhibits tenderness.       Right hip: She exhibits tenderness.       Legs: Neurological: She is alert and oriented to person, place, and time.  Skin: Skin is warm and dry.  Psychiatric: She has a normal mood and affect. Her behavior is normal. Judgment and thought content normal.  Nursing note and vitals reviewed.   ED Course  Procedures (including critical care time) Labs Review Labs Reviewed - No data to display  Imaging Review Dg Hip Port Unilat With Pelvis 1v Right  07/11/2015  CLINICAL DATA:  Bent over and felt right hip popped today. History of dislocations. EXAM: DG HIP (WITH OR WITHOUT PELVIS) 1V PORT RIGHT COMPARISON:  None. FINDINGS: There is dislocation of the right hip replacement. Cross-table lateral views are nondiagnostic. No fracture visualized. IMPRESSION: Dislocation of the right hip replacement.  No visualized fracture.  Electronically Signed   By: Rolm Baptise M.D.   On: 07/11/2015 19:20   I have personally reviewed and evaluated these images and lab results as part of my medical decision-making.   EKG Interpretation None      MDM  PT D/W DR. DALLDORF ([facility]) WHO WILL TAKE PT TO THE OR FOR REDUCTION. Final diagnoses:  Hip dislocation, right, initial encounter [facility])        Isla Pence, MD 07/11/15 2351

## 2015-07-11 NOTE — ED Notes (Addendum)
PER EMS: pt from home with c/o hip pain after she bent over and heard a pop. No fall. She thinks she may have dislocated her right hip, hx of hip dislocations and recent hip surgery on November 16. EMS adm 14mg  of morphine and pt still complains of severe right hip pain upon arrival.

## 2015-07-11 NOTE — Op Note (Signed)
Deborah Maldonado [id number] 07/11/2015   PRE-OP DIAGNOSIS: dislocated right THR  POST-OP DIAGNOSIS: same  PROCEDURE: relocation of THR dislocation  ANESTHESIA: general  [name] G   Dictation #:  [id number]

## 2015-07-12 ENCOUNTER — Encounter (HOSPITAL_COMMUNITY): Payer: Self-pay | Admitting: Orthopaedic Surgery

## 2015-07-12 DIAGNOSIS — I10 Essential (primary) hypertension: Secondary | ICD-10-CM | POA: Insufficient documentation

## 2015-07-12 DIAGNOSIS — N958 Other specified menopausal and perimenopausal disorders: Secondary | ICD-10-CM | POA: Diagnosis not present

## 2015-07-12 NOTE — Evaluation (Signed)
Physical Therapy Evaluation Patient Details Name: Deborah Maldonado MRN: [id number] DOB: [birth year] Today's Date: 07/12/2015   History of Present Illness  pt admitted for right hip dislocation s/p reduction. PMHx of right THA and 1 prior dislocation, HTN, hypothyroidism, depression, fibromyalgia, and bipolar.  Clinical Impression  Pt able to transfer and ambulate without assistance. Pt reports that she knows her precautions, but has difficulty maintaining them in situations where she feels rushed. Pt with decreased strength and mobility, who would benefit from acute therapy to maximize her mobility and increase quality of life. Educated pt on precautions, HEP, and adduction brace use. Pt with safe mobility, but supervision required so pt can adhere to precautions.    Follow Up Recommendations No PT follow up    Equipment Recommendations  None recommended by PT    Recommendations for Other Services       Precautions / Restrictions Precautions Precautions: Posterior Hip Precaution Booklet Issued: Yes (comment) Required Braces or Orthoses: Other Brace/Splint Other Brace/Splint: adduction brace Restrictions Weight Bearing Restrictions: Yes RLE Weight Bearing: Weight bearing as tolerated      Mobility  Bed Mobility Overal bed mobility: Needs Assistance Bed Mobility: Supine to Sit     Supine to sit: HOB elevated;Supervision     General bed mobility comments: HOB elevated upon arrival, with pt able to pivot to EOB with ease. cues for maintaining hip precautions when turning towards EOB.  Transfers Overall transfer level: Needs assistance   Transfers: Sit to/from Stand Sit to Stand: Supervision         General transfer comment: cues for RLE and hand position. no assistance needed.  Ambulation/Gait Ambulation/Gait assistance: Supervision Ambulation Distance (Feet): 250 Feet Assistive device: None Gait Pattern/deviations: Step-through pattern;Decreased stride length    Gait velocity interpretation: Below normal speed for age/gender General Gait Details: cues for sequence when turning towards right side. recommended walking with a cane if pt needs additional stability, with pt agreeable.  Stairs Stairs: Yes Stairs assistance: Supervision Stair Management: Step to pattern;Forwards;One rail Right Number of Stairs: 2 General stair comments: pt able to ascend stairs without cues. educated pt on ascending stairs sideways, with pt preferring to ascend fowards.  Wheelchair Mobility    Modified Rankin (Stroke Patients Only)       Balance Overall balance assessment: No apparent balance deficits (not formally assessed)                                           Pertinent Vitals/Pain Pain Assessment: 0-10 Pain Score: 6  Pain Location: right hip Pain Descriptors / Indicators: Aching Pain Intervention(s): Limited activity within patient's tolerance;Monitored during session;Repositioned    Home Living Family/patient expects to be discharged to:: Private residence Living Arrangements: Spouse/significant other;Children Available Help at Discharge: Family;Available PRN/intermittently Type of Home: House Home Access: Stairs to enter Entrance Stairs-Rails: None Entrance Stairs-Number of Steps: 1 Home Layout: Able to live on main level with bedroom/bathroom;Full bath on main level Home Equipment: Cane - single point      Prior Function Level of Independence: Independent               Hand Dominance        Extremity/Trunk Assessment   Upper Extremity Assessment: Overall WFL for tasks assessed           Lower Extremity Assessment: RLE deficits/detail RLE Deficits / Details: decreased strength and ROM  due to pain    Cervical / Trunk Assessment: Normal  Communication   Communication: No difficulties  Cognition Arousal/Alertness: Awake/alert Behavior During Therapy: WFL for tasks assessed/performed Overall Cognitive  Status: Within Functional Limits for tasks assessed                      General Comments      Exercises Total Joint Exercises Hip ABduction/ADduction: AROM;Right;10 reps;Standing Knee Flexion: AROM;Right;Standing;10 reps Marching in Standing: AROM;Right;Standing;10 reps Standing Hip Extension: AROM;Right;Standing;10 reps      Assessment/Plan    PT Assessment Patient needs continued PT services  PT Diagnosis Difficulty walking;Acute pain;Generalized weakness   PT Problem List Decreased strength;Decreased range of motion;Decreased activity tolerance;Decreased mobility;Decreased knowledge of precautions;Decreased safety awareness  PT Treatment Interventions DME instruction;Gait training;Stair training;Functional mobility training;Therapeutic activities;Therapeutic exercise;Patient/family education   PT Goals (Current goals can be found in the Care Plan section) Acute Rehab PT Goals Patient Stated Goal: go home PT Goal Formulation: With patient Time For Goal Achievement: 07/19/15 Potential to Achieve Goals: Good    Frequency Min 5X/week   Barriers to discharge Decreased caregiver support      Co-evaluation               End of Session Equipment Utilized During Treatment: Gait belt Activity Tolerance: Patient tolerated treatment well Patient left: in chair;with family/visitor present;with call bell/phone within reach           Time: 1125-1155 PT Time Calculation (min) (ACUTE ONLY): 30 min   Charges:   PT Evaluation $PT Eval Moderate Complexity: 1 Procedure PT Treatments $Therapeutic Activity: 8-22 mins   PT G CodesHaze Justin 08-01-15, 12:22 PM  Haze Justin, SPT [phone]

## 2015-07-12 NOTE — Progress Notes (Signed)
PT Cancellation Note  Patient Details Name: Deborah Maldonado MRN: [id number] DOB: Sep 21, 1965   Cancelled Treatment:    Reason Eval/Treat Not Completed: Other (comment) (Await activity order and weight bearing status for evaluation)   Melford Aase 07/12/2015, 7:12 AM Elwyn Reach, [place]

## 2015-07-12 NOTE — Progress Notes (Signed)
Orthopedic Tech Progress Note Patient Details:  [name] 1965/04/13 [id number]  Patient ID: Deborah Maldonado, female   DOB: 1965-05-06, 50 y.o.   MRN: [id number] Called in [employer] brace order; spoke with Deborah Maldonado, Deborah Maldonado 07/12/2015, 9:07 AM

## 2015-07-12 NOTE — Discharge Summary (Signed)
Patient ID: CZARINA STANDEFER MRN: [id number] DOB/AGE: 1965-05-24 50 y.o.  Admit date: 07/11/2015 Discharge date: 07/12/2015  Admission Diagnoses:  Active Problems:   Hip dislocation, right [facility])   Discharge Diagnoses:  Same  Past Medical History  Diagnosis Date  . Hypertension     takes Hyzaar daily  . Hypothyroidism     takes Synthroid daily  . Depression     takes Prozac daily  . Seasonal allergies     uses Dymista daily and takes Allegra daily  . Pneumonia 2014  . History of bronchitis early 2016  . Headache     occasionally  . Fibromyalgia     takes Lyrica daily  . Arthritis   . Joint pain   . Back pain     facet joint encapsulated   . GERD (gastroesophageal reflux disease)     takes Dexilant daily  . Slow urinary stream     bladder stem stretched this year  . Bipolar disorder ([employer])     takes Lithium daily  . Insomnia     takes Triazolam nightly    Surgeries: Procedure(s): CLOSED REDUCTION HIP on 07/11/2015   Consultants: Treatment Team:  Frederik Pear, MD  Discharged Condition: Improved  Hospital Course: ASIANA ELTER is an 50 y.o. 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 Patient has severe unremitting pain that affects sleep, daily activities, and work/hobbies. After pre-op clearance the patient was taken to the operating room on 07/11/2015 and underwent  Procedure(s): CLOSED REDUCTION HIP.    Patient was given perioperative antibiotics: Anti-infectives    None     Postoperatively she is seen by biotech and fitted with a hinge abduction brace abduction set at 15 flexion set at 90 maximum. She'll wear the brace continuously for the next 3 months. We also had a discussion about the psychotropic medicine she is taking specifically Seroquel, Lyrica, and trazodone which have made her groggy and increase her risk of dislocation and adherence to posterior hip  precautions.  Patient was given sequential compression devices, early ambulation, and chemoprophylaxis to prevent DVT.  Patient benefited maximally from hospital stay and there were no complications.    Recent vital signs: Patient Vitals for the past 24 hrs:  BP Temp Temp src Pulse Resp SpO2  07/12/15 0534 101/63 mmHg 97.8 F (36.6 C) Oral 83 17 98 %  07/12/15 0201 (!) 85/55 mmHg 98.3 F (36.8 C) Axillary 86 17 97 %  07/11/15 2330 130/75 mmHg 98.3 F (36.8 C) - 94 18 98 %  07/11/15 2311 120/73 mmHg 97.9 F (36.6 C) - 91 13 97 %  07/11/15 2300 118/74 mmHg - - 89 11 99 %  07/11/15 2253 115/79 mmHg - - 90 10 98 %  07/11/15 2248 115/79 mmHg - - 87 10 98 %  07/11/15 2245 105/72 mmHg 97.5 F (36.4 C) - 85 10 98 %  07/11/15 2030 (!) 117/43 mmHg - - 91 13 97 %  07/11/15 2015 113/77 mmHg - - 85 17 98 %  07/11/15 2000 128/77 mmHg - - 84 11 (!) 88 %  07/11/15 1920 118/77 mmHg - - 85 22 97 %  07/11/15 1915 118/77 mmHg - - - 15 -     Recent laboratory studies: No results for input(s): WBC, HGB, HCT, PLT, NA, K, CL, CO2, BUN, CREATININE, GLUCOSE, INR, CALCIUM in the last 72 hours.  Invalid input(s): PT, 2   Discharge  Medications:     Medication List    STOP taking these medications        aspirin EC 325 MG tablet     methocarbamol 500 MG tablet  Commonly known as:  ROBAXIN     oxyCODONE-acetaminophen 5-325 MG tablet  Commonly known as:  ROXICET      TAKE these medications        CULTURELLE DIGESTIVE HEALTH PO  Take 1 capsule by mouth daily.     DEXILANT 60 MG capsule  Generic drug:  dexlansoprazole  Take 60 mg by mouth daily.     diclofenac 75 MG EC tablet  Commonly known as:  VOLTAREN  Take 75 mg by mouth 2 (two) times daily.     DYMISTA 137-50 MCG/ACT Susp  Generic drug:  Azelastine-Fluticasone  Place 1 spray into the nose 2 (two) times daily.     furosemide 40 MG tablet  Commonly known as:  LASIX  Take 40 mg by mouth every morning.     ibuprofen 800 MG tablet   Commonly known as:  ADVIL,MOTRIN  Take 800 mg by mouth every 8 (eight) hours as needed for moderate pain.     levothyroxine 150 MCG tablet  Commonly known as:  SYNTHROID, LEVOTHROID  Take 150 mcg by mouth daily before breakfast.     lithium carbonate 450 MG CR tablet  Commonly known as:  ESKALITH  Take 450 mg by mouth 4 (four) times daily.     losartan-hydrochlorothiazide 100-25 MG tablet  Commonly known as:  HYZAAR  Take 1 tablet by mouth daily.     potassium chloride SA 20 MEQ tablet  Commonly known as:  K-DUR,KLOR-CON  Take 40 mEq by mouth 2 (two) times daily.     pregabalin 225 MG capsule  Commonly known as:  LYRICA  Take 225 mg by mouth 2 (two) times daily.     QUEtiapine 400 MG tablet  Commonly known as:  SEROQUEL  Take 200 mg by mouth at bedtime.     traZODone 100 MG tablet  Commonly known as:  DESYREL  Take 1 tablet by mouth daily.        Diagnostic Studies: Dg Hip Port Unilat With Pelvis 1v Right  07/11/2015  CLINICAL DATA:  Bent over and felt right hip popped today. History of dislocations. EXAM: DG HIP (WITH OR WITHOUT PELVIS) 1V PORT RIGHT COMPARISON:  None. FINDINGS: There is dislocation of the right hip replacement. Cross-table lateral views are nondiagnostic. No fracture visualized. IMPRESSION: Dislocation of the right hip replacement.  No visualized fracture. Electronically Signed   By: Rolm Baptise M.D.   On: 07/11/2015 19:20   Dg Hip Operative Unilat W Or W/o Pelvis Right  07/12/2015  CLINICAL DATA:  Hip Dislocation EXAM: OPERATIVE RIGHT HIP (WITH PELVIS IF PERFORMED) 1 VIEWS TECHNIQUE: Fluoroscopic spot image(s) were submitted for interpretation post-operatively. COMPARISON:  07/11/15 FINDINGS: Satisfactory reduction. IMPRESSION: As above. Electronically Signed   By: Staci Righter M.D.   On: 07/12/2015 07:06    Disposition: 06-Home-Health Care Svc      Discharge Instructions    Call MD / Call 911    Complete by:  As directed   If you experience chest  pain or shortness of breath, CALL 911 and be transported to the hospital emergency room.  If you develope a fever above 101 F, pus (white drainage) or increased drainage or redness at the wound, or calf pain, call your surgeon's office.     Constipation Prevention  Complete by:  As directed   Drink plenty of fluids.  Prune juice may be helpful.  You may use a stool softener, such as Colace (over the counter) 100 mg twice a day.  Use MiraLax (over the counter) for constipation as needed.     Diet - low sodium heart healthy    Complete by:  As directed      Driving restrictions    Complete by:  As directed   Must wear hinge abduction brace when driving     Increase activity slowly as tolerated    Complete by:  As directed            Follow-up Information    Follow up with Kerin Salen, MD In 1 week.   Specialty:  Orthopedic Surgery   Contact information:   [place] [address] [phone]        Signed: Kerin Salen 07/12/2015, 7:41 AM

## 2015-07-12 NOTE — Discharge Instructions (Signed)
INSTRUCTIONS AFTER JOINT REPLACEMENT   o Remove items at home which could result in a fall. This includes throw rugs or furniture in walking pathways o ICE to the affected joint every three hours while awake for 30 minutes at a time, for at least the first 3-5 days, and then as needed for pain and swelling.  Continue to use ice for pain and swelling. You may notice swelling that will progress down to the foot and ankle.  This is normal after surgery.  Elevate your leg when you are not up walking on it.   o Continue to use the breathing machine you got in the hospital (incentive spirometer) which will help keep your temperature down.  It is common for your temperature to cycle up and down following surgery, especially at night when you are not up moving around and exerting yourself.  The breathing machine keeps your lungs expanded and your temperature down.   DIET:  As you were doing prior to hospitalization, we recommend a well-balanced diet.   ACTIVITY  o Increase activity slowly as tolerated, but follow the weight bearing instructions below.   o No driving for 6 weeks or until further direction given by your physician.  You cannot drive while taking narcotics.  o No lifting or carrying greater than 10 lbs. until further directed by your surgeon. o Avoid periods of inactivity such as sitting longer than an hour when not asleep. This helps prevent blood clots.  o You may return to work once you are authorized by your doctor.     WEIGHT BEARING   Other:  Weightbearing as tolerated, most were hinge abduction brace at all times except when showering or bathing with assistance. Strict posterior precautions.   EXERCISES  Results after joint replacement surgery are often greatly improved when you follow the exercise, range of motion and muscle strengthening exercises prescribed by your doctor. Safety measures are also important to protect the joint from further injury. Any time any of these  exercises cause you to have increased pain or swelling, decrease what you are doing until you are comfortable again and then slowly increase them. If you have problems or questions, call your caregiver or physical therapist for advice.   Rehabilitation is important following a joint replacement. After just a few days of immobilization, the muscles of the leg can become weakened and shrink (atrophy).  These exercises are designed to build up the tone and strength of the thigh and leg muscles and to improve motion. Often times heat used for twenty to thirty minutes before working out will loosen up your tissues and help with improving the range of motion but do not use heat for the first two weeks following surgery (sometimes heat can increase post-operative swelling).   These exercises can be done on a training (exercise) mat, on the floor, on a table or on a bed. Use whatever works the best and is most comfortable for you.    Use music or television while you are exercising so that the exercises are a pleasant break in your day. This will make your life better with the exercises acting as a break in your routine that you can look forward to.   Perform all exercises about fifteen times, three times per day or as directed.  You should exercise both the operative leg and the other leg as well.  Exercises include:    Quad Sets - Tighten up the muscle on the front of the thigh [employer])  and hold for 5-10 seconds.    Straight Leg Raises - With your knee straight (if you were given a brace, keep it on), lift the leg to 60 degrees, hold for 3 seconds, and slowly lower the leg.  Perform this exercise against resistance later as your leg gets stronger.   Leg Slides: Lying on your back, slowly slide your foot toward your buttocks, bending your knee up off the floor (only go as far as is comfortable). Then slowly slide your foot back down until your leg is flat on the floor again.   Angel Wings: Lying on your back  spread your legs to the side as far apart as you can without causing discomfort.   Hamstring Strength:  Lying on your back, push your heel against the floor with your leg straight by tightening up the muscles of your buttocks.  Repeat, but this time bend your knee to a comfortable angle, and push your heel against the floor.  You may put a pillow under the heel to make it more comfortable if necessary.   A rehabilitation program following joint replacement surgery can speed recovery and prevent re-injury in the future due to weakened muscles. Contact your doctor or a physical therapist for more information on knee rehabilitation.    CONSTIPATION  Constipation is defined medically as fewer than three stools per week and severe constipation as less than one stool per week.  Even if you have a regular bowel pattern at home, your normal regimen is likely to be disrupted due to multiple reasons following surgery.  Combination of anesthesia, postoperative narcotics, change in appetite and fluid intake all can affect your bowels.   YOU MUST use at least one of the following options; they are listed in order of increasing strength to get the job done.  They are all available over the counter, and you may need to use some, POSSIBLY even all of these options:    Drink plenty of fluids (prune juice may be helpful) and high fiber foods Colace 100 mg by mouth twice a day  Senokot for constipation as directed and as needed Dulcolax (bisacodyl), take with full glass of water  Miralax (polyethylene glycol) once or twice a day as needed.  If you have tried all these things and are unable to have a bowel movement in the first 3-4 days after surgery call either your surgeon or your primary doctor.    If you experience loose stools or diarrhea, hold the medications until you stool forms back up.  If your symptoms do not get better within 1 week or if they get worse, check with your doctor.  If you experience "the  worst abdominal pain ever" or develop nausea or vomiting, please contact the office immediately for further recommendations for treatment.   ITCHING:  If you experience itching with your medications, try taking only a single pain pill, or even half a pain pill at a time.  You can also use Benadryl over the counter for itching or also to help with sleep.   TED HOSE STOCKINGS:  Use stockings on both legs until for at least 2 weeks or as directed by physician office. They may be removed at night for sleeping.  MEDICATIONS:  See your medication summary on the After Visit Summary that nursing will review with you.  You may have some home medications which will be placed on hold until you complete the course of blood thinner medication.  It is important for you  to complete the blood thinner medication as prescribed.  PRECAUTIONS:  If you experience chest pain or shortness of breath - call 911 immediately for transfer to the hospital emergency department.   If you develop a fever greater that 101 F, purulent drainage from wound, increased redness or drainage from wound, foul odor from the wound/dressing, or calf pain - CONTACT YOUR SURGEON.                                                   FOLLOW-UP APPOINTMENTS:  If you do not already have a post-op appointment, please call the office for an appointment to be seen by your surgeon.  Guidelines for how soon to be seen are listed in your After Visit Summary, but are typically between 1-4 weeks after surgery.  OTHER INSTRUCTIONS:   Knee Replacement:  Do not place pillow under knee, focus on keeping the knee straight while resting. CPM instructions: 0-90 degrees, 2 hours in the morning, 2 hours in the afternoon, and 2 hours in the evening. Place foam block, curve side up under heel at all times except when in CPM or when walking.  DO NOT modify, tear, cut, or change the foam block in any way.  MAKE SURE YOU:   Understand these instructions.   Get  help right away if you are not doing well or get worse.    Thank you for letting us be a part of your medical care team.  It is a privilege we respect greatly.  We hope these instructions will help you stay on track for a fast and full recovery!

## 2015-07-12 NOTE — Care Management (Signed)
Spoke with patient and husband at bedside . Waiting on brace and PT eval . If home health recommended , provided choice , patient would like [facility] . Confirmed face sheet information.    Patient has walker at home already .  Magdalen Spatz RN BSN [phone]

## 2015-07-12 NOTE — Progress Notes (Signed)
Patient ID: Deborah Maldonado, female   DOB: Jul 31, 1965, 50 y.o.   MRN: [id number] Subjective: Patient had arms full of groceries was getting out of car yesterday and bent down to help one of her cats and sustained a posterior dislocation of her right total hip. This total hip was placed in November 2016 dislocated in January 2016 with a similar flexion incident. In addition she is treated for bipolar disorder and is on 200 mg of Seroquel, large doses of Lyrica, as well as trazodone. Her Seroquel dosing is relatively new and has made her quite groggy. She presented to the emergency room yesterday and underwent closed reduction by my partner Dr. Demetrius Revel at approximately 2200 hrs. last evening. Today she is resting in bed comfortably reporting little if any discomfort.  Objective: Foot tap is negative internal, Rotation the hip in the supine position causes no pain as does axial loading. Skin is intact there's little if any swelling. The patient is somewhat groggy, and was seen today with her husband. He reports that the psychotropic medications have affected her mental status on. X-rays reviewed showing a geometrically stable total hip anteversion of the acetabular component is approximately 30 she has an S-ROM stem.  Assessment: Second dislocation of geometrically stable right total up arthroplasty secondary to getting in extreme flexion and adduction.  Plan: She'll be fit with a hinge abduction brace today limiting her to 90 of flexion and the hinge be set at 15 of abduction. She'll return to my office for follow-up in a week to 10 days. We will discontinue the IV today and she'll receive no new pain medications.

## 2015-07-12 NOTE — Op Note (Signed)
Deborah Maldonado, Deborah Maldonado               ACCOUNT NO.:  [id number]  MEDICAL RECORD NO.:  [id number]  LOCATION:  [id number]                        FACILITY:  [facility]  PHYSICIAN:  Monico Blitz. Tyreke Kaeser, M.D.DATE OF BIRTH:  16-Jul-1965  DATE OF PROCEDURE:  07/11/2015 DATE OF DISCHARGE:  07/12/2015                              OPERATIVE REPORT   PREOPERATIVE DIAGNOSIS:  Right total hip replacement dislocation.  POSTOPERATIVE DIAGNOSIS:  Right total hip replacement dislocation.  PROCEDURE:  Right total hip replacement relocation.  ANESTHESIA:  General.  ATTENDING SURGEON:  Monico Blitz. Rhona Raider, M.D.  INDICATION FOR PROCEDURE:  The patient is a 50 year old woman, who is less than a year from a hip replacement.  She suffered a postoperative dislocation about 4 months back; and unfortunately today, she was bending over in her garage and felt her hip come out of place.  She was taken to the emergency room.  She was scheduled for close reduction under anesthesia.  Informed operative consent was obtained after discussion of possible complications including reaction to anesthesia, infection, and fracture.  The importance of the postoperative bracing program was stressed extensively with the patient and the possibility for need for revision down the road was also discussed.  SUMMARY OF FINDINGS AND PROCEDURE:  Under general mask anesthesia, a closed reduction was performed.  This was confirmed under fluoroscopy to be in placed.  She was placed in a knee immobilizer and admitted for appropriate attention.  DESCRIPTION OF PROCEDURE:  The patient was taken to the operating suite, where general anesthetic was applied without difficulty.  She was positioned supine.  With some flexion at the hip and longitudinal traction, the hip came back into place relatively easily.  Then tested the hip up to 60 degrees of flexion and remained stable.  Then used fluoroscopy to confirm adequate reduction of this dislocation.   She was then placed in a knee immobilizer.  Estimated blood loss and fluids, obtain from anesthesia records.  DISPOSITION:  The patient was taken to recovery room in stable condition.  She was admitted to the Orthopedic Surgery Service for probable placement of a brace in the morning.     Monico Blitz Rhona Raider, M.D.     PGD/MEDQ  D:  07/11/2015  T:  07/12/2015  Job:  [id number]

## 2015-07-13 NOTE — Anesthesia Postprocedure Evaluation (Signed)
Anesthesia Post Note  Patient: Deborah Maldonado  Procedure(s) Performed: Procedure(s) (LRB): CLOSED REDUCTION HIP (Right)  Patient location during evaluation: PACU Anesthesia Type: General Level of consciousness: awake and alert and patient cooperative Pain management: pain level controlled Vital Signs Assessment: post-procedure vital signs reviewed and stable Respiratory status: spontaneous breathing and respiratory function stable Cardiovascular status: stable Anesthetic complications: no    Last Vitals:  Filed Vitals:   07/12/15 0201 07/12/15 0534  BP: 85/55 101/63  Pulse: 86 83  Temp: 36.8 C 36.6 C  Resp: 17 17    Last Pain:  Filed Vitals:   07/12/15 1110  PainSc: Saunders S

## 2015-07-19 DIAGNOSIS — M25551 Pain in right hip: Secondary | ICD-10-CM | POA: Diagnosis not present

## 2015-07-20 DIAGNOSIS — R0600 Dyspnea, unspecified: Secondary | ICD-10-CM | POA: Diagnosis not present

## 2015-07-20 DIAGNOSIS — G4733 Obstructive sleep apnea (adult) (pediatric): Secondary | ICD-10-CM | POA: Diagnosis not present

## 2015-07-20 DIAGNOSIS — J301 Allergic rhinitis due to pollen: Secondary | ICD-10-CM | POA: Diagnosis not present

## 2015-07-20 DIAGNOSIS — F3174 Bipolar disorder, in full remission, most recent episode manic: Secondary | ICD-10-CM | POA: Diagnosis not present

## 2015-07-20 DIAGNOSIS — G4737 Central sleep apnea in conditions classified elsewhere: Secondary | ICD-10-CM | POA: Diagnosis not present

## 2015-07-21 DIAGNOSIS — G4733 Obstructive sleep apnea (adult) (pediatric): Secondary | ICD-10-CM | POA: Diagnosis not present

## 2015-07-26 DIAGNOSIS — D509 Iron deficiency anemia, unspecified: Secondary | ICD-10-CM | POA: Diagnosis not present

## 2015-07-26 DIAGNOSIS — K581 Irritable bowel syndrome with constipation: Secondary | ICD-10-CM | POA: Diagnosis not present

## 2015-07-27 DIAGNOSIS — D509 Iron deficiency anemia, unspecified: Secondary | ICD-10-CM | POA: Diagnosis not present

## 2015-08-02 DIAGNOSIS — D509 Iron deficiency anemia, unspecified: Secondary | ICD-10-CM | POA: Diagnosis not present

## 2015-08-04 DIAGNOSIS — F3174 Bipolar disorder, in full remission, most recent episode manic: Secondary | ICD-10-CM | POA: Diagnosis not present

## 2015-08-09 DIAGNOSIS — D509 Iron deficiency anemia, unspecified: Secondary | ICD-10-CM | POA: Diagnosis not present

## 2015-08-16 DIAGNOSIS — G44209 Tension-type headache, unspecified, not intractable: Secondary | ICD-10-CM | POA: Diagnosis not present

## 2015-08-16 DIAGNOSIS — M67911 Unspecified disorder of synovium and tendon, right shoulder: Secondary | ICD-10-CM | POA: Diagnosis not present

## 2015-08-16 DIAGNOSIS — M530 Cervicocranial syndrome: Secondary | ICD-10-CM | POA: Diagnosis not present

## 2015-08-16 DIAGNOSIS — M9902 Segmental and somatic dysfunction of thoracic region: Secondary | ICD-10-CM | POA: Diagnosis not present

## 2015-08-16 DIAGNOSIS — M25511 Pain in right shoulder: Secondary | ICD-10-CM | POA: Diagnosis not present

## 2015-08-16 DIAGNOSIS — M9901 Segmental and somatic dysfunction of cervical region: Secondary | ICD-10-CM | POA: Diagnosis not present

## 2015-08-16 DIAGNOSIS — M25551 Pain in right hip: Secondary | ICD-10-CM | POA: Diagnosis not present

## 2015-08-18 DIAGNOSIS — G44209 Tension-type headache, unspecified, not intractable: Secondary | ICD-10-CM | POA: Diagnosis not present

## 2015-08-18 DIAGNOSIS — M530 Cervicocranial syndrome: Secondary | ICD-10-CM | POA: Diagnosis not present

## 2015-08-18 DIAGNOSIS — M9902 Segmental and somatic dysfunction of thoracic region: Secondary | ICD-10-CM | POA: Diagnosis not present

## 2015-08-18 DIAGNOSIS — M9901 Segmental and somatic dysfunction of cervical region: Secondary | ICD-10-CM | POA: Diagnosis not present

## 2015-08-18 DIAGNOSIS — F3174 Bipolar disorder, in full remission, most recent episode manic: Secondary | ICD-10-CM | POA: Diagnosis not present

## 2015-08-22 DIAGNOSIS — R5383 Other fatigue: Secondary | ICD-10-CM | POA: Diagnosis not present

## 2015-08-22 DIAGNOSIS — R0600 Dyspnea, unspecified: Secondary | ICD-10-CM | POA: Diagnosis not present

## 2015-08-22 DIAGNOSIS — G4733 Obstructive sleep apnea (adult) (pediatric): Secondary | ICD-10-CM | POA: Diagnosis not present

## 2015-08-22 DIAGNOSIS — J301 Allergic rhinitis due to pollen: Secondary | ICD-10-CM | POA: Diagnosis not present

## 2015-08-23 DIAGNOSIS — M9902 Segmental and somatic dysfunction of thoracic region: Secondary | ICD-10-CM | POA: Diagnosis not present

## 2015-08-23 DIAGNOSIS — G44209 Tension-type headache, unspecified, not intractable: Secondary | ICD-10-CM | POA: Diagnosis not present

## 2015-08-23 DIAGNOSIS — M530 Cervicocranial syndrome: Secondary | ICD-10-CM | POA: Diagnosis not present

## 2015-08-23 DIAGNOSIS — M9901 Segmental and somatic dysfunction of cervical region: Secondary | ICD-10-CM | POA: Diagnosis not present

## 2015-08-26 DIAGNOSIS — G4733 Obstructive sleep apnea (adult) (pediatric): Secondary | ICD-10-CM | POA: Diagnosis not present

## 2015-08-30 DIAGNOSIS — G44209 Tension-type headache, unspecified, not intractable: Secondary | ICD-10-CM | POA: Diagnosis not present

## 2015-08-30 DIAGNOSIS — M9901 Segmental and somatic dysfunction of cervical region: Secondary | ICD-10-CM | POA: Diagnosis not present

## 2015-08-30 DIAGNOSIS — M530 Cervicocranial syndrome: Secondary | ICD-10-CM | POA: Diagnosis not present

## 2015-08-30 DIAGNOSIS — M9902 Segmental and somatic dysfunction of thoracic region: Secondary | ICD-10-CM | POA: Diagnosis not present

## 2015-08-31 DIAGNOSIS — R5383 Other fatigue: Secondary | ICD-10-CM | POA: Diagnosis not present

## 2015-08-31 DIAGNOSIS — R0600 Dyspnea, unspecified: Secondary | ICD-10-CM | POA: Diagnosis not present

## 2015-08-31 DIAGNOSIS — G4733 Obstructive sleep apnea (adult) (pediatric): Secondary | ICD-10-CM | POA: Diagnosis not present

## 2015-08-31 DIAGNOSIS — J301 Allergic rhinitis due to pollen: Secondary | ICD-10-CM | POA: Diagnosis not present

## 2015-09-01 ENCOUNTER — Encounter: Payer: Self-pay | Admitting: Gastroenterology

## 2015-09-01 DIAGNOSIS — K5900 Constipation, unspecified: Secondary | ICD-10-CM | POA: Diagnosis not present

## 2015-09-01 DIAGNOSIS — D509 Iron deficiency anemia, unspecified: Secondary | ICD-10-CM | POA: Diagnosis not present

## 2015-09-01 DIAGNOSIS — D127 Benign neoplasm of rectosigmoid junction: Secondary | ICD-10-CM | POA: Diagnosis not present

## 2015-09-01 DIAGNOSIS — K635 Polyp of colon: Secondary | ICD-10-CM | POA: Diagnosis not present

## 2015-09-01 DIAGNOSIS — K649 Unspecified hemorrhoids: Secondary | ICD-10-CM | POA: Diagnosis not present

## 2015-09-01 DIAGNOSIS — K581 Irritable bowel syndrome with constipation: Secondary | ICD-10-CM | POA: Diagnosis not present

## 2015-09-01 DIAGNOSIS — K648 Other hemorrhoids: Secondary | ICD-10-CM | POA: Diagnosis not present

## 2015-09-01 DIAGNOSIS — K5904 Chronic idiopathic constipation: Secondary | ICD-10-CM | POA: Diagnosis not present

## 2015-09-01 DIAGNOSIS — Q438 Other specified congenital malformations of intestine: Secondary | ICD-10-CM | POA: Diagnosis not present

## 2015-09-02 DIAGNOSIS — M542 Cervicalgia: Secondary | ICD-10-CM | POA: Diagnosis not present

## 2015-09-02 DIAGNOSIS — M47812 Spondylosis without myelopathy or radiculopathy, cervical region: Secondary | ICD-10-CM | POA: Diagnosis not present

## 2015-09-02 DIAGNOSIS — M75101 Unspecified rotator cuff tear or rupture of right shoulder, not specified as traumatic: Secondary | ICD-10-CM | POA: Diagnosis not present

## 2015-09-15 DIAGNOSIS — F3174 Bipolar disorder, in full remission, most recent episode manic: Secondary | ICD-10-CM | POA: Diagnosis not present

## 2015-09-21 DIAGNOSIS — J45909 Unspecified asthma, uncomplicated: Secondary | ICD-10-CM | POA: Diagnosis not present

## 2015-09-21 DIAGNOSIS — M797 Fibromyalgia: Secondary | ICD-10-CM | POA: Diagnosis not present

## 2015-09-21 DIAGNOSIS — Z6841 Body Mass Index (BMI) 40.0 and over, adult: Secondary | ICD-10-CM | POA: Diagnosis not present

## 2015-09-21 DIAGNOSIS — R600 Localized edema: Secondary | ICD-10-CM | POA: Diagnosis not present

## 2015-09-28 DIAGNOSIS — M25511 Pain in right shoulder: Secondary | ICD-10-CM | POA: Diagnosis not present

## 2015-10-11 DIAGNOSIS — D509 Iron deficiency anemia, unspecified: Secondary | ICD-10-CM | POA: Diagnosis not present

## 2015-10-18 DIAGNOSIS — G4733 Obstructive sleep apnea (adult) (pediatric): Secondary | ICD-10-CM | POA: Diagnosis not present

## 2015-10-19 DIAGNOSIS — J301 Allergic rhinitis due to pollen: Secondary | ICD-10-CM | POA: Diagnosis not present

## 2015-10-19 DIAGNOSIS — R5383 Other fatigue: Secondary | ICD-10-CM | POA: Diagnosis not present

## 2015-10-19 DIAGNOSIS — F3174 Bipolar disorder, in full remission, most recent episode manic: Secondary | ICD-10-CM | POA: Diagnosis not present

## 2015-10-19 DIAGNOSIS — G4733 Obstructive sleep apnea (adult) (pediatric): Secondary | ICD-10-CM | POA: Diagnosis not present

## 2015-10-19 DIAGNOSIS — R0600 Dyspnea, unspecified: Secondary | ICD-10-CM | POA: Diagnosis not present

## 2015-11-10 DIAGNOSIS — M25511 Pain in right shoulder: Secondary | ICD-10-CM | POA: Diagnosis not present

## 2015-11-11 DIAGNOSIS — G4733 Obstructive sleep apnea (adult) (pediatric): Secondary | ICD-10-CM | POA: Diagnosis not present

## 2015-11-17 DIAGNOSIS — F3131 Bipolar disorder, current episode depressed, mild: Secondary | ICD-10-CM | POA: Diagnosis not present

## 2015-12-01 DIAGNOSIS — F3110 Bipolar disorder, current episode manic without psychotic features, unspecified: Secondary | ICD-10-CM | POA: Diagnosis not present

## 2015-12-06 DIAGNOSIS — N390 Urinary tract infection, site not specified: Secondary | ICD-10-CM | POA: Diagnosis not present

## 2015-12-06 DIAGNOSIS — E039 Hypothyroidism, unspecified: Secondary | ICD-10-CM | POA: Diagnosis not present

## 2015-12-06 DIAGNOSIS — D509 Iron deficiency anemia, unspecified: Secondary | ICD-10-CM | POA: Diagnosis not present

## 2015-12-06 DIAGNOSIS — F3110 Bipolar disorder, current episode manic without psychotic features, unspecified: Secondary | ICD-10-CM | POA: Diagnosis not present

## 2015-12-06 DIAGNOSIS — R7301 Impaired fasting glucose: Secondary | ICD-10-CM | POA: Diagnosis not present

## 2015-12-13 DIAGNOSIS — F3110 Bipolar disorder, current episode manic without psychotic features, unspecified: Secondary | ICD-10-CM | POA: Diagnosis not present

## 2015-12-14 DIAGNOSIS — F4323 Adjustment disorder with mixed anxiety and depressed mood: Secondary | ICD-10-CM | POA: Diagnosis not present

## 2016-01-03 DIAGNOSIS — F3174 Bipolar disorder, in full remission, most recent episode manic: Secondary | ICD-10-CM | POA: Diagnosis not present

## 2016-01-04 DIAGNOSIS — J208 Acute bronchitis due to other specified organisms: Secondary | ICD-10-CM | POA: Diagnosis not present

## 2016-01-04 DIAGNOSIS — K644 Residual hemorrhoidal skin tags: Secondary | ICD-10-CM | POA: Diagnosis not present

## 2016-01-04 DIAGNOSIS — Z6841 Body Mass Index (BMI) 40.0 and over, adult: Secondary | ICD-10-CM | POA: Diagnosis not present

## 2016-01-04 DIAGNOSIS — J019 Acute sinusitis, unspecified: Secondary | ICD-10-CM | POA: Diagnosis not present

## 2016-01-16 DIAGNOSIS — G4733 Obstructive sleep apnea (adult) (pediatric): Secondary | ICD-10-CM | POA: Diagnosis not present

## 2016-01-17 DIAGNOSIS — R0600 Dyspnea, unspecified: Secondary | ICD-10-CM | POA: Diagnosis not present

## 2016-01-17 DIAGNOSIS — J301 Allergic rhinitis due to pollen: Secondary | ICD-10-CM | POA: Diagnosis not present

## 2016-01-17 DIAGNOSIS — G4733 Obstructive sleep apnea (adult) (pediatric): Secondary | ICD-10-CM | POA: Diagnosis not present

## 2016-01-17 DIAGNOSIS — R5383 Other fatigue: Secondary | ICD-10-CM | POA: Diagnosis not present

## 2016-01-19 DIAGNOSIS — M25551 Pain in right hip: Secondary | ICD-10-CM | POA: Diagnosis not present

## 2016-01-20 ENCOUNTER — Other Ambulatory Visit: Payer: Self-pay | Admitting: Orthopedic Surgery

## 2016-01-20 DIAGNOSIS — M25551 Pain in right hip: Secondary | ICD-10-CM

## 2016-01-31 DIAGNOSIS — F3131 Bipolar disorder, current episode depressed, mild: Secondary | ICD-10-CM | POA: Diagnosis not present

## 2016-02-02 DIAGNOSIS — H5010 Unspecified exotropia: Secondary | ICD-10-CM | POA: Diagnosis not present

## 2016-02-07 ENCOUNTER — Ambulatory Visit
Admission: RE | Admit: 2016-02-07 | Discharge: 2016-02-07 | Disposition: A | Discharge status: Home / Self Care | Payer: BLUE CROSS/BLUE SHIELD | Source: Ambulatory Visit | Attending: Orthopedic Surgery | Admitting: Orthopedic Surgery

## 2016-02-07 DIAGNOSIS — M25551 Pain in right hip: Secondary | ICD-10-CM

## 2016-02-08 DIAGNOSIS — M530 Cervicocranial syndrome: Secondary | ICD-10-CM | POA: Diagnosis not present

## 2016-02-08 DIAGNOSIS — M9902 Segmental and somatic dysfunction of thoracic region: Secondary | ICD-10-CM | POA: Diagnosis not present

## 2016-02-08 DIAGNOSIS — G44209 Tension-type headache, unspecified, not intractable: Secondary | ICD-10-CM | POA: Diagnosis not present

## 2016-02-08 DIAGNOSIS — M9901 Segmental and somatic dysfunction of cervical region: Secondary | ICD-10-CM | POA: Diagnosis not present

## 2016-02-13 DIAGNOSIS — G44209 Tension-type headache, unspecified, not intractable: Secondary | ICD-10-CM | POA: Diagnosis not present

## 2016-02-13 DIAGNOSIS — M9901 Segmental and somatic dysfunction of cervical region: Secondary | ICD-10-CM | POA: Diagnosis not present

## 2016-02-13 DIAGNOSIS — M9902 Segmental and somatic dysfunction of thoracic region: Secondary | ICD-10-CM | POA: Diagnosis not present

## 2016-02-13 DIAGNOSIS — M530 Cervicocranial syndrome: Secondary | ICD-10-CM | POA: Diagnosis not present

## 2016-02-22 DIAGNOSIS — Z6841 Body Mass Index (BMI) 40.0 and over, adult: Secondary | ICD-10-CM | POA: Diagnosis not present

## 2016-02-22 DIAGNOSIS — J019 Acute sinusitis, unspecified: Secondary | ICD-10-CM | POA: Diagnosis not present

## 2016-02-22 DIAGNOSIS — R399 Unspecified symptoms and signs involving the genitourinary system: Secondary | ICD-10-CM | POA: Diagnosis not present

## 2016-03-06 DIAGNOSIS — G4733 Obstructive sleep apnea (adult) (pediatric): Secondary | ICD-10-CM | POA: Diagnosis not present

## 2016-03-07 DIAGNOSIS — J301 Allergic rhinitis due to pollen: Secondary | ICD-10-CM | POA: Diagnosis not present

## 2016-03-07 DIAGNOSIS — R0600 Dyspnea, unspecified: Secondary | ICD-10-CM | POA: Diagnosis not present

## 2016-03-07 DIAGNOSIS — R5383 Other fatigue: Secondary | ICD-10-CM | POA: Diagnosis not present

## 2016-03-07 DIAGNOSIS — G4733 Obstructive sleep apnea (adult) (pediatric): Secondary | ICD-10-CM | POA: Diagnosis not present

## 2016-03-12 DIAGNOSIS — E876 Hypokalemia: Secondary | ICD-10-CM | POA: Diagnosis not present

## 2016-03-12 DIAGNOSIS — M797 Fibromyalgia: Secondary | ICD-10-CM | POA: Diagnosis not present

## 2016-03-12 DIAGNOSIS — Z6841 Body Mass Index (BMI) 40.0 and over, adult: Secondary | ICD-10-CM | POA: Diagnosis not present

## 2016-03-12 DIAGNOSIS — E039 Hypothyroidism, unspecified: Secondary | ICD-10-CM | POA: Diagnosis not present

## 2016-03-13 DIAGNOSIS — F3132 Bipolar disorder, current episode depressed, moderate: Secondary | ICD-10-CM | POA: Diagnosis not present

## 2016-03-20 DIAGNOSIS — M9901 Segmental and somatic dysfunction of cervical region: Secondary | ICD-10-CM | POA: Diagnosis not present

## 2016-03-20 DIAGNOSIS — M9902 Segmental and somatic dysfunction of thoracic region: Secondary | ICD-10-CM | POA: Diagnosis not present

## 2016-03-20 DIAGNOSIS — M530 Cervicocranial syndrome: Secondary | ICD-10-CM | POA: Diagnosis not present

## 2016-03-20 DIAGNOSIS — G44209 Tension-type headache, unspecified, not intractable: Secondary | ICD-10-CM | POA: Diagnosis not present

## 2016-03-23 DIAGNOSIS — M530 Cervicocranial syndrome: Secondary | ICD-10-CM | POA: Diagnosis not present

## 2016-03-23 DIAGNOSIS — M9902 Segmental and somatic dysfunction of thoracic region: Secondary | ICD-10-CM | POA: Diagnosis not present

## 2016-03-23 DIAGNOSIS — G44209 Tension-type headache, unspecified, not intractable: Secondary | ICD-10-CM | POA: Diagnosis not present

## 2016-03-23 DIAGNOSIS — M9901 Segmental and somatic dysfunction of cervical region: Secondary | ICD-10-CM | POA: Diagnosis not present

## 2016-03-28 DIAGNOSIS — F3132 Bipolar disorder, current episode depressed, moderate: Secondary | ICD-10-CM | POA: Diagnosis not present

## 2016-04-05 HISTORY — PX: NASAL SINUS SURGERY: SHX719

## 2016-04-09 DIAGNOSIS — Z6841 Body Mass Index (BMI) 40.0 and over, adult: Secondary | ICD-10-CM | POA: Diagnosis not present

## 2016-04-09 DIAGNOSIS — M797 Fibromyalgia: Secondary | ICD-10-CM | POA: Diagnosis not present

## 2016-04-09 DIAGNOSIS — J019 Acute sinusitis, unspecified: Secondary | ICD-10-CM | POA: Diagnosis not present

## 2016-04-10 DIAGNOSIS — F3174 Bipolar disorder, in full remission, most recent episode manic: Secondary | ICD-10-CM | POA: Diagnosis not present

## 2016-04-11 DIAGNOSIS — J320 Chronic maxillary sinusitis: Secondary | ICD-10-CM | POA: Diagnosis not present

## 2016-04-11 DIAGNOSIS — J343 Hypertrophy of nasal turbinates: Secondary | ICD-10-CM | POA: Diagnosis not present

## 2016-04-12 DIAGNOSIS — Z862 Personal history of diseases of the blood and blood-forming organs and certain disorders involving the immune mechanism: Secondary | ICD-10-CM | POA: Diagnosis not present

## 2016-04-12 DIAGNOSIS — D509 Iron deficiency anemia, unspecified: Secondary | ICD-10-CM | POA: Diagnosis not present

## 2016-04-12 DIAGNOSIS — J328 Other chronic sinusitis: Secondary | ICD-10-CM | POA: Diagnosis not present

## 2016-04-17 DIAGNOSIS — S31831A Laceration without foreign body of anus, initial encounter: Secondary | ICD-10-CM | POA: Diagnosis not present

## 2016-04-17 DIAGNOSIS — K602 Anal fissure, unspecified: Secondary | ICD-10-CM | POA: Diagnosis not present

## 2016-04-17 DIAGNOSIS — Z6841 Body Mass Index (BMI) 40.0 and over, adult: Secondary | ICD-10-CM | POA: Diagnosis not present

## 2016-04-17 DIAGNOSIS — Z1389 Encounter for screening for other disorder: Secondary | ICD-10-CM | POA: Diagnosis not present

## 2016-04-19 DIAGNOSIS — F4322 Adjustment disorder with anxiety: Secondary | ICD-10-CM | POA: Diagnosis not present

## 2016-04-19 DIAGNOSIS — F3131 Bipolar disorder, current episode depressed, mild: Secondary | ICD-10-CM | POA: Diagnosis not present

## 2016-04-26 DIAGNOSIS — F3131 Bipolar disorder, current episode depressed, mild: Secondary | ICD-10-CM | POA: Diagnosis not present

## 2016-04-26 DIAGNOSIS — F419 Anxiety disorder, unspecified: Secondary | ICD-10-CM | POA: Diagnosis not present

## 2016-04-27 DIAGNOSIS — J343 Hypertrophy of nasal turbinates: Secondary | ICD-10-CM | POA: Diagnosis not present

## 2016-04-27 DIAGNOSIS — J320 Chronic maxillary sinusitis: Secondary | ICD-10-CM | POA: Diagnosis not present

## 2016-05-08 DIAGNOSIS — F3131 Bipolar disorder, current episode depressed, mild: Secondary | ICD-10-CM | POA: Diagnosis not present

## 2016-05-11 DIAGNOSIS — J340 Abscess, furuncle and carbuncle of nose: Secondary | ICD-10-CM | POA: Diagnosis not present

## 2016-05-11 DIAGNOSIS — J320 Chronic maxillary sinusitis: Secondary | ICD-10-CM | POA: Diagnosis not present

## 2016-05-11 DIAGNOSIS — J343 Hypertrophy of nasal turbinates: Secondary | ICD-10-CM | POA: Diagnosis not present

## 2016-05-15 DIAGNOSIS — E876 Hypokalemia: Secondary | ICD-10-CM | POA: Diagnosis not present

## 2016-05-15 DIAGNOSIS — D509 Iron deficiency anemia, unspecified: Secondary | ICD-10-CM | POA: Diagnosis not present

## 2016-05-15 DIAGNOSIS — E039 Hypothyroidism, unspecified: Secondary | ICD-10-CM | POA: Diagnosis not present

## 2016-05-15 DIAGNOSIS — S3141XA Laceration without foreign body of vagina and vulva, initial encounter: Secondary | ICD-10-CM | POA: Diagnosis not present

## 2016-05-15 DIAGNOSIS — E785 Hyperlipidemia, unspecified: Secondary | ICD-10-CM | POA: Diagnosis not present

## 2016-05-22 DIAGNOSIS — R5383 Other fatigue: Secondary | ICD-10-CM | POA: Diagnosis not present

## 2016-05-22 DIAGNOSIS — R0600 Dyspnea, unspecified: Secondary | ICD-10-CM | POA: Diagnosis not present

## 2016-05-22 DIAGNOSIS — J301 Allergic rhinitis due to pollen: Secondary | ICD-10-CM | POA: Diagnosis not present

## 2016-05-22 DIAGNOSIS — G4733 Obstructive sleep apnea (adult) (pediatric): Secondary | ICD-10-CM | POA: Diagnosis not present

## 2016-05-24 DIAGNOSIS — Z1231 Encounter for screening mammogram for malignant neoplasm of breast: Secondary | ICD-10-CM | POA: Diagnosis not present

## 2016-05-30 DIAGNOSIS — H50112 Monocular exotropia, left eye: Secondary | ICD-10-CM | POA: Diagnosis not present

## 2016-06-03 HISTORY — PX: EYE SURGERY: SHX253

## 2016-06-07 DIAGNOSIS — F3174 Bipolar disorder, in full remission, most recent episode manic: Secondary | ICD-10-CM | POA: Diagnosis not present

## 2016-06-11 DIAGNOSIS — J343 Hypertrophy of nasal turbinates: Secondary | ICD-10-CM | POA: Diagnosis not present

## 2016-06-11 DIAGNOSIS — J320 Chronic maxillary sinusitis: Secondary | ICD-10-CM | POA: Diagnosis not present

## 2016-07-02 DIAGNOSIS — M542 Cervicalgia: Secondary | ICD-10-CM | POA: Diagnosis not present

## 2016-07-02 DIAGNOSIS — M546 Pain in thoracic spine: Secondary | ICD-10-CM | POA: Diagnosis not present

## 2016-07-02 DIAGNOSIS — M9901 Segmental and somatic dysfunction of cervical region: Secondary | ICD-10-CM | POA: Diagnosis not present

## 2016-07-02 DIAGNOSIS — M545 Low back pain: Secondary | ICD-10-CM | POA: Diagnosis not present

## 2016-07-03 DIAGNOSIS — M25551 Pain in right hip: Secondary | ICD-10-CM | POA: Diagnosis not present

## 2016-07-03 DIAGNOSIS — F3131 Bipolar disorder, current episode depressed, mild: Secondary | ICD-10-CM | POA: Diagnosis not present

## 2016-07-04 DIAGNOSIS — H50112 Monocular exotropia, left eye: Secondary | ICD-10-CM | POA: Diagnosis not present

## 2016-07-10 DIAGNOSIS — J301 Allergic rhinitis due to pollen: Secondary | ICD-10-CM | POA: Diagnosis not present

## 2016-07-10 DIAGNOSIS — R0600 Dyspnea, unspecified: Secondary | ICD-10-CM | POA: Diagnosis not present

## 2016-07-10 DIAGNOSIS — G4733 Obstructive sleep apnea (adult) (pediatric): Secondary | ICD-10-CM | POA: Diagnosis not present

## 2016-07-10 DIAGNOSIS — R5383 Other fatigue: Secondary | ICD-10-CM | POA: Diagnosis not present

## 2016-07-17 DIAGNOSIS — M542 Cervicalgia: Secondary | ICD-10-CM | POA: Diagnosis not present

## 2016-07-17 DIAGNOSIS — M9901 Segmental and somatic dysfunction of cervical region: Secondary | ICD-10-CM | POA: Diagnosis not present

## 2016-07-17 DIAGNOSIS — M546 Pain in thoracic spine: Secondary | ICD-10-CM | POA: Diagnosis not present

## 2016-07-17 DIAGNOSIS — M545 Low back pain: Secondary | ICD-10-CM | POA: Diagnosis not present

## 2016-07-20 DIAGNOSIS — Z6837 Body mass index (BMI) 37.0-37.9, adult: Secondary | ICD-10-CM | POA: Diagnosis not present

## 2016-07-20 DIAGNOSIS — K210 Gastro-esophageal reflux disease with esophagitis: Secondary | ICD-10-CM | POA: Diagnosis not present

## 2016-07-20 DIAGNOSIS — R1013 Epigastric pain: Secondary | ICD-10-CM | POA: Diagnosis not present

## 2016-07-20 DIAGNOSIS — M4722 Other spondylosis with radiculopathy, cervical region: Secondary | ICD-10-CM | POA: Diagnosis not present

## 2016-07-23 DIAGNOSIS — M4722 Other spondylosis with radiculopathy, cervical region: Secondary | ICD-10-CM | POA: Diagnosis not present

## 2016-07-23 DIAGNOSIS — M4802 Spinal stenosis, cervical region: Secondary | ICD-10-CM | POA: Diagnosis not present

## 2016-07-26 DIAGNOSIS — M542 Cervicalgia: Secondary | ICD-10-CM | POA: Diagnosis not present

## 2016-07-26 DIAGNOSIS — M5412 Radiculopathy, cervical region: Secondary | ICD-10-CM | POA: Diagnosis not present

## 2016-07-26 DIAGNOSIS — M4722 Other spondylosis with radiculopathy, cervical region: Secondary | ICD-10-CM | POA: Diagnosis not present

## 2016-08-02 DIAGNOSIS — F3174 Bipolar disorder, in full remission, most recent episode manic: Secondary | ICD-10-CM | POA: Diagnosis not present

## 2016-08-03 DIAGNOSIS — A4902 Methicillin resistant Staphylococcus aureus infection, unspecified site: Secondary | ICD-10-CM

## 2016-08-03 HISTORY — DX: Methicillin resistant Staphylococcus aureus infection, unspecified site: A49.02

## 2016-08-08 DIAGNOSIS — M542 Cervicalgia: Secondary | ICD-10-CM | POA: Diagnosis not present

## 2016-08-20 IMAGING — CR DG PORTABLE PELVIS
1 series · 1 of 1 positions shown · non-contrast
Comparison: 09/02/2014 preoperative radiographs.

CLINICAL DATA: Postop right hip arthroplasty.  Initial encounter.

EXAM:
PORTABLE PELVIS 1-2 VIEWS

[AP]
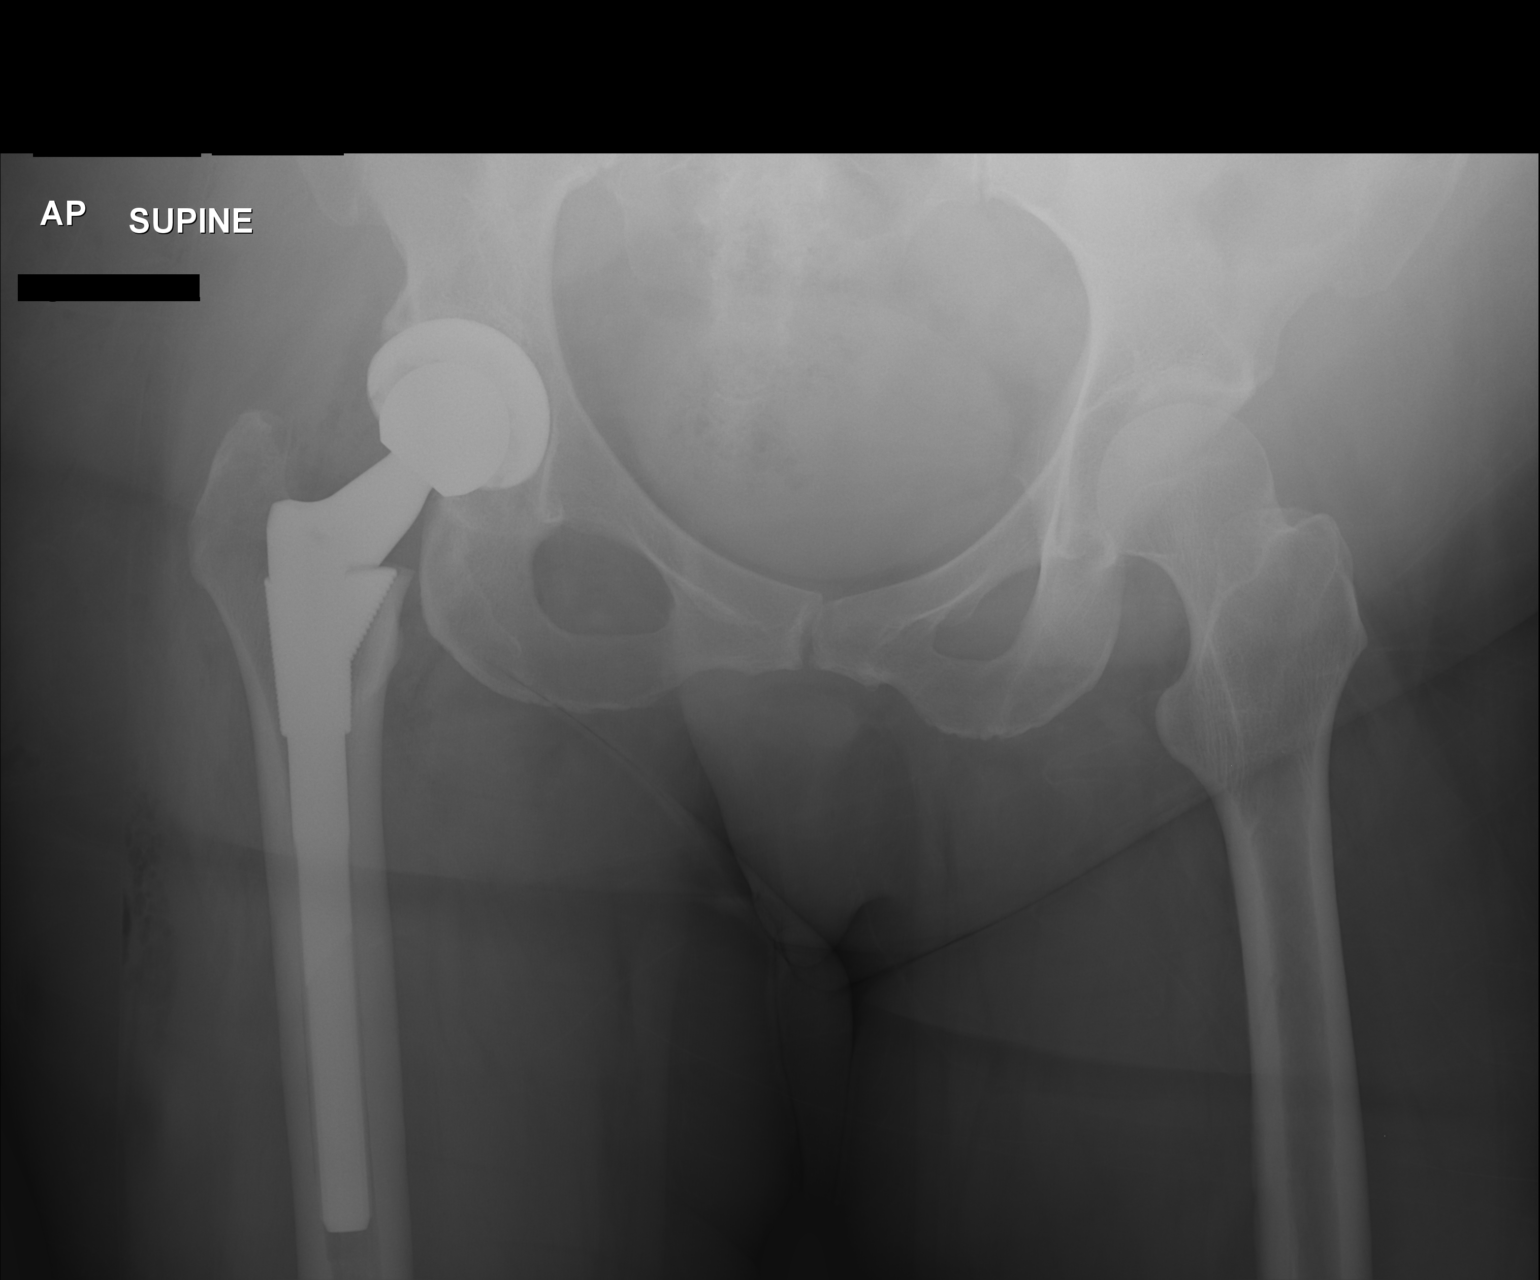

[1 of 1 positions shown; findings below may reference images not displayed]

FINDINGS: 1345 hours. AP portable examination of the pelvis excludes the iliac
crests. The patient is status post right total hip arthroplasty. The
hardware appears well positioned. No evidence of acute fracture or
dislocation. A small amount of gas is present laterally in the
proximal right thigh.
IMPRESSION: No demonstrated complication following right total hip arthroplasty.

## 2016-08-21 DIAGNOSIS — E785 Hyperlipidemia, unspecified: Secondary | ICD-10-CM | POA: Diagnosis not present

## 2016-08-21 DIAGNOSIS — E039 Hypothyroidism, unspecified: Secondary | ICD-10-CM | POA: Diagnosis not present

## 2016-08-21 DIAGNOSIS — D509 Iron deficiency anemia, unspecified: Secondary | ICD-10-CM | POA: Diagnosis not present

## 2016-08-21 DIAGNOSIS — E876 Hypokalemia: Secondary | ICD-10-CM | POA: Diagnosis not present

## 2016-08-21 DIAGNOSIS — Z6837 Body mass index (BMI) 37.0-37.9, adult: Secondary | ICD-10-CM | POA: Diagnosis not present

## 2016-08-22 DIAGNOSIS — M5412 Radiculopathy, cervical region: Secondary | ICD-10-CM | POA: Diagnosis not present

## 2016-08-22 DIAGNOSIS — M4722 Other spondylosis with radiculopathy, cervical region: Secondary | ICD-10-CM | POA: Diagnosis not present

## 2016-08-22 DIAGNOSIS — M542 Cervicalgia: Secondary | ICD-10-CM | POA: Diagnosis not present

## 2016-08-28 DIAGNOSIS — I781 Nevus, non-neoplastic: Secondary | ICD-10-CM | POA: Diagnosis not present

## 2016-08-28 DIAGNOSIS — L814 Other melanin hyperpigmentation: Secondary | ICD-10-CM | POA: Diagnosis not present

## 2016-08-30 DIAGNOSIS — F3174 Bipolar disorder, in full remission, most recent episode manic: Secondary | ICD-10-CM | POA: Diagnosis not present

## 2016-09-03 DIAGNOSIS — D171 Benign lipomatous neoplasm of skin and subcutaneous tissue of trunk: Secondary | ICD-10-CM | POA: Diagnosis not present

## 2016-09-17 DIAGNOSIS — F3174 Bipolar disorder, in full remission, most recent episode manic: Secondary | ICD-10-CM | POA: Diagnosis not present

## 2016-09-28 ENCOUNTER — Encounter (HOSPITAL_BASED_OUTPATIENT_CLINIC_OR_DEPARTMENT_OTHER): Payer: Self-pay | Admitting: *Deleted

## 2016-10-01 ENCOUNTER — Ambulatory Visit: Payer: Self-pay | Admitting: Plastic Surgery

## 2016-10-01 DIAGNOSIS — D171 Benign lipomatous neoplasm of skin and subcutaneous tissue of trunk: Secondary | ICD-10-CM

## 2016-10-01 NOTE — H&P (Signed)
Deborah Maldonado is an 51 y.o. female.   Chief Complaint: lipoma HPI: The patient is a 51 y.o. yrs old wf here with her husband for pre operative history and physical prior to excision and liposuction of her right gluteal area lipoma. About 8 months ago she fell on the steps and hit her buttock area on the marble floor.  She had a large hematoma of the area that was evaluated with a CT showing a 4 x 10 x 12 cm area.  Her right total hip arthroplasty was intact.   She now has a 6 x 10 cm area of firm adipose.  There is no discoloration or brusing present. It is tender with direct pressure. And there is an notable deformity with asymmetry from the other side.   Past Medical History:  Diagnosis Date  . Anxiety   . Arthritis   . Asthma   . Back pain    facet joint encapsulated   . Bipolar disorder ([place])    takes Lithium daily  . Depression    takes Prozac daily  . Fibromyalgia    takes Lyrica daily  . GERD (gastroesophageal reflux disease)    takes Dexilant daily  . Headache    occasionally  . History of bronchitis early 2016  . Hypertension    takes Hyzaar daily  . Hypothyroidism    takes Synthroid daily  . Insomnia    takes Triazolam nightly  . Joint pain   . Pneumonia 2014  . Seasonal allergies    uses Dymista daily and takes Allegra daily  . Sleep apnea    has not used CPAP since sinus surgery 8mos ago  . Slow urinary stream    bladder stem stretched this year    Past Surgical History:  Procedure Laterality Date  . ABDOMINAL HYSTERECTOMY    . APPENDECTOMY    . CHOLECYSTECTOMY    . EYE SURGERY  06/2016   at [facility]  . HIP CLOSED REDUCTION Right 07/11/2015   Procedure: CLOSED REDUCTION HIP;  Surgeon: Melrose Nakayama, MD;  Location: [facility];  Service: Orthopedics;  Laterality: Right;  . NASAL SINUS SURGERY  04/2016   turbinate reduction  . TOTAL HIP ARTHROPLASTY Right 01/19/2015   Procedure: RIGHT TOTAL HIP ARTHROPLASTY;  Surgeon: Frederik Pear, MD;  Location: [facility];  Service:  Orthopedics;  Laterality: Right;  . TUBAL LIGATION      History reviewed. No pertinent family history. Social History:  reports that she has never smoked. She has never used smokeless tobacco. She reports that she does not drink alcohol or use drugs.  Allergies:  Allergies  Allergen Reactions  . Penicillins Anaphylaxis  . Morphine And Related     Bad headache    No prescriptions prior to admission.    No results found for this or any previous visit (from the past 48 hour(s)). No results found.  Review of Systems  Constitutional: Negative.   HENT: Negative.   Eyes: Negative.   Respiratory: Negative.   Cardiovascular: Negative.   Gastrointestinal: Negative.   Genitourinary: Negative.   Musculoskeletal: Negative.   Skin: Negative.   Neurological: Negative.   Psychiatric/Behavioral: Negative.     Height 5\' 7"  (1.702 m), weight 105.2 kg (232 lb). Physical Exam  Constitutional: She is oriented to person, place, and time. She appears well-developed and well-nourished.  HENT:  Head: Normocephalic and atraumatic.  Eyes: Pupils are equal, round, and reactive to light. EOM are normal.  Cardiovascular: Normal rate.   Respiratory:  Effort normal.  GI: Soft.  Musculoskeletal:       Back:  Neurological: She is alert and oriented to person, place, and time.  Skin: Skin is warm. No erythema.  Psychiatric: She has a normal mood and affect. Her behavior is normal. Judgment and thought content normal.     Assessment/Plan Excision of left gluteal lipoma with possible liposuction.  Wallace Going, DO 10/01/2016, 6:18 PM

## 2016-10-02 ENCOUNTER — Encounter (HOSPITAL_BASED_OUTPATIENT_CLINIC_OR_DEPARTMENT_OTHER)
Admission: RE | Admit: 2016-10-02 | Discharge: 2016-10-02 | Disposition: A | Discharge status: Home / Self Care | Payer: BLUE CROSS/BLUE SHIELD | Source: Ambulatory Visit | Attending: Plastic Surgery | Admitting: Plastic Surgery

## 2016-10-02 DIAGNOSIS — G4730 Sleep apnea, unspecified: Secondary | ICD-10-CM | POA: Diagnosis not present

## 2016-10-02 DIAGNOSIS — K219 Gastro-esophageal reflux disease without esophagitis: Secondary | ICD-10-CM | POA: Diagnosis not present

## 2016-10-02 DIAGNOSIS — F319 Bipolar disorder, unspecified: Secondary | ICD-10-CM | POA: Diagnosis not present

## 2016-10-02 DIAGNOSIS — E039 Hypothyroidism, unspecified: Secondary | ICD-10-CM | POA: Diagnosis not present

## 2016-10-02 DIAGNOSIS — I10 Essential (primary) hypertension: Secondary | ICD-10-CM | POA: Diagnosis not present

## 2016-10-02 DIAGNOSIS — F419 Anxiety disorder, unspecified: Secondary | ICD-10-CM | POA: Diagnosis not present

## 2016-10-02 DIAGNOSIS — Z880 Allergy status to penicillin: Secondary | ICD-10-CM | POA: Diagnosis not present

## 2016-10-02 DIAGNOSIS — Z79899 Other long term (current) drug therapy: Secondary | ICD-10-CM | POA: Diagnosis not present

## 2016-10-02 DIAGNOSIS — W109XXA Fall (on) (from) unspecified stairs and steps, initial encounter: Secondary | ICD-10-CM | POA: Diagnosis not present

## 2016-10-02 DIAGNOSIS — F329 Major depressive disorder, single episode, unspecified: Secondary | ICD-10-CM | POA: Diagnosis not present

## 2016-10-02 DIAGNOSIS — D171 Benign lipomatous neoplasm of skin and subcutaneous tissue of trunk: Secondary | ICD-10-CM | POA: Diagnosis not present

## 2016-10-02 DIAGNOSIS — G4700 Insomnia, unspecified: Secondary | ICD-10-CM | POA: Diagnosis not present

## 2016-10-02 DIAGNOSIS — Z885 Allergy status to narcotic agent status: Secondary | ICD-10-CM | POA: Diagnosis not present

## 2016-10-02 DIAGNOSIS — Z96641 Presence of right artificial hip joint: Secondary | ICD-10-CM | POA: Diagnosis not present

## 2016-10-02 DIAGNOSIS — M797 Fibromyalgia: Secondary | ICD-10-CM | POA: Diagnosis not present

## 2016-10-02 DIAGNOSIS — M1990 Unspecified osteoarthritis, unspecified site: Secondary | ICD-10-CM | POA: Diagnosis not present

## 2016-10-02 DIAGNOSIS — J45909 Unspecified asthma, uncomplicated: Secondary | ICD-10-CM | POA: Diagnosis not present

## 2016-10-02 DIAGNOSIS — Z6836 Body mass index (BMI) 36.0-36.9, adult: Secondary | ICD-10-CM | POA: Diagnosis not present

## 2016-10-02 LAB — BASIC METABOLIC PANEL
ANION GAP: 5 (ref 5–15)
BUN: 14 mg/dL (ref 6–20)
CALCIUM: 9.3 mg/dL (ref 8.9–10.3)
CO2: 28 mmol/L (ref 22–32)
CREATININE: 0.68 mg/dL (ref 0.44–1.00)
Chloride: 106 mmol/L (ref 101–111)
Glucose, Bld: 95 mg/dL (ref 65–99)
Potassium: 4 mmol/L (ref 3.5–5.1)
Sodium: 139 mmol/L (ref 135–145)

## 2016-10-02 NOTE — Progress Notes (Signed)
EKG reviewed by Dr. Gifford Shave, will proceed with surgery as scheduled.  Dr. Gifford Shave would like previous EKG/ Stress test results. Called pt and left message with return number.

## 2016-10-03 ENCOUNTER — Ambulatory Visit (HOSPITAL_BASED_OUTPATIENT_CLINIC_OR_DEPARTMENT_OTHER): Payer: BLUE CROSS/BLUE SHIELD | Admitting: Anesthesiology

## 2016-10-03 ENCOUNTER — Encounter (HOSPITAL_BASED_OUTPATIENT_CLINIC_OR_DEPARTMENT_OTHER): Payer: Self-pay | Admitting: *Deleted

## 2016-10-03 ENCOUNTER — Encounter (HOSPITAL_BASED_OUTPATIENT_CLINIC_OR_DEPARTMENT_OTHER)
Admission: RE | Disposition: A | Discharge status: Home / Self Care | Payer: Self-pay | Source: Ambulatory Visit | Attending: Plastic Surgery

## 2016-10-03 ENCOUNTER — Ambulatory Visit (HOSPITAL_BASED_OUTPATIENT_CLINIC_OR_DEPARTMENT_OTHER)
Admission: RE | Admit: 2016-10-03 | Discharge: 2016-10-04 | Disposition: A | Discharge status: Home / Self Care | Payer: BLUE CROSS/BLUE SHIELD | Source: Ambulatory Visit | Attending: Plastic Surgery | Admitting: Plastic Surgery

## 2016-10-03 DIAGNOSIS — M797 Fibromyalgia: Secondary | ICD-10-CM | POA: Diagnosis not present

## 2016-10-03 DIAGNOSIS — F329 Major depressive disorder, single episode, unspecified: Secondary | ICD-10-CM | POA: Diagnosis not present

## 2016-10-03 DIAGNOSIS — Z885 Allergy status to narcotic agent status: Secondary | ICD-10-CM | POA: Diagnosis not present

## 2016-10-03 DIAGNOSIS — Z6836 Body mass index (BMI) 36.0-36.9, adult: Secondary | ICD-10-CM | POA: Insufficient documentation

## 2016-10-03 DIAGNOSIS — Z79899 Other long term (current) drug therapy: Secondary | ICD-10-CM | POA: Insufficient documentation

## 2016-10-03 DIAGNOSIS — K219 Gastro-esophageal reflux disease without esophagitis: Secondary | ICD-10-CM | POA: Insufficient documentation

## 2016-10-03 DIAGNOSIS — F419 Anxiety disorder, unspecified: Secondary | ICD-10-CM | POA: Insufficient documentation

## 2016-10-03 DIAGNOSIS — E039 Hypothyroidism, unspecified: Secondary | ICD-10-CM | POA: Diagnosis not present

## 2016-10-03 DIAGNOSIS — F319 Bipolar disorder, unspecified: Secondary | ICD-10-CM | POA: Insufficient documentation

## 2016-10-03 DIAGNOSIS — D179 Benign lipomatous neoplasm, unspecified: Secondary | ICD-10-CM

## 2016-10-03 DIAGNOSIS — G4730 Sleep apnea, unspecified: Secondary | ICD-10-CM | POA: Insufficient documentation

## 2016-10-03 DIAGNOSIS — G4700 Insomnia, unspecified: Secondary | ICD-10-CM | POA: Insufficient documentation

## 2016-10-03 DIAGNOSIS — Z96641 Presence of right artificial hip joint: Secondary | ICD-10-CM | POA: Insufficient documentation

## 2016-10-03 DIAGNOSIS — I10 Essential (primary) hypertension: Secondary | ICD-10-CM | POA: Diagnosis not present

## 2016-10-03 DIAGNOSIS — M1990 Unspecified osteoarthritis, unspecified site: Secondary | ICD-10-CM | POA: Insufficient documentation

## 2016-10-03 DIAGNOSIS — I872 Venous insufficiency (chronic) (peripheral): Secondary | ICD-10-CM | POA: Diagnosis not present

## 2016-10-03 DIAGNOSIS — J45909 Unspecified asthma, uncomplicated: Secondary | ICD-10-CM | POA: Diagnosis not present

## 2016-10-03 DIAGNOSIS — D1779 Benign lipomatous neoplasm of other sites: Secondary | ICD-10-CM | POA: Diagnosis not present

## 2016-10-03 DIAGNOSIS — Z880 Allergy status to penicillin: Secondary | ICD-10-CM | POA: Insufficient documentation

## 2016-10-03 DIAGNOSIS — M1611 Unilateral primary osteoarthritis, right hip: Secondary | ICD-10-CM | POA: Diagnosis not present

## 2016-10-03 DIAGNOSIS — W109XXA Fall (on) (from) unspecified stairs and steps, initial encounter: Secondary | ICD-10-CM | POA: Insufficient documentation

## 2016-10-03 DIAGNOSIS — D171 Benign lipomatous neoplasm of skin and subcutaneous tissue of trunk: Secondary | ICD-10-CM | POA: Diagnosis not present

## 2016-10-03 HISTORY — DX: Anxiety disorder, unspecified: F41.9

## 2016-10-03 HISTORY — DX: Unspecified asthma, uncomplicated: J45.909

## 2016-10-03 HISTORY — PX: MASS EXCISION: SHX2000

## 2016-10-03 HISTORY — DX: Sleep apnea, unspecified: G47.30

## 2016-10-03 LAB — BASIC METABOLIC PANEL
Anion gap: 7 (ref 5–15)
BUN: 11 mg/dL (ref 6–20)
CALCIUM: 9.4 mg/dL (ref 8.9–10.3)
CHLORIDE: 103 mmol/L (ref 101–111)
CO2: 27 mmol/L (ref 22–32)
CREATININE: 0.76 mg/dL (ref 0.44–1.00)
GFR calc non Af Amer: >60 mL/min (ref >60)
GLUCOSE: 174 mg/dL — AB (ref 65–99)
Potassium: 3.5 mmol/L (ref 3.5–5.1)
Sodium: 137 mmol/L (ref 135–145)

## 2016-10-03 LAB — CBC
HCT: 39.7 % (ref 36.0–46.0)
HEMOGLOBIN: 13.1 g/dL (ref 12.0–15.0)
MCH: 26.7 pg (ref 26.0–34.0)
MCHC: 33 g/dL (ref 30.0–36.0)
MCV: 81 fL (ref 78.0–100.0)
PLATELETS: 261 10*3/uL (ref 150–400)
RBC: 4.9 MIL/uL (ref 3.87–5.11)
RDW: 14 % (ref 11.5–15.5)
WBC: 14 10*3/uL — ABNORMAL HIGH (ref 4.0–10.5)

## 2016-10-03 SURGERY — EXCISION MASS
Anesthesia: General | Site: Buttocks | Laterality: Right

## 2016-10-03 MED ORDER — FENTANYL CITRATE (PF) 100 MCG/2ML IJ SOLN
INTRAMUSCULAR | Status: AC
  Filled 2016-10-03: qty 2

## 2016-10-03 MED ORDER — EPINEPHRINE 30 MG/30ML IJ SOLN
INTRAMUSCULAR | Status: AC
  Filled 2016-10-03: qty 1

## 2016-10-03 MED ORDER — PROPOFOL 10 MG/ML IV BOLUS
INTRAVENOUS | Status: AC
  Filled 2016-10-03: qty 20

## 2016-10-03 MED ORDER — ONDANSETRON HCL 4 MG/2ML IJ SOLN: 4.0000 mg | Freq: Four times a day (QID) | INTRAMUSCULAR | Status: DC | PRN

## 2016-10-03 MED ORDER — BUPIVACAINE-EPINEPHRINE 0.25% -1:200000 IJ SOLN
INTRAMUSCULAR | Status: AC
  Filled 2016-10-03: qty 1

## 2016-10-03 MED ORDER — ZOLPIDEM TARTRATE 5 MG PO TABS: 5.0000 mg | ORAL_TABLET | Freq: Every evening | ORAL | Status: DC | PRN

## 2016-10-03 MED ORDER — MIDAZOLAM HCL 2 MG/2ML IJ SOLN
INTRAMUSCULAR | Status: AC
  Filled 2016-10-03: qty 2

## 2016-10-03 MED ORDER — SCOPOLAMINE 1 MG/3DAYS TD PT72: 1.0000 | MEDICATED_PATCH | Freq: Once | TRANSDERMAL | Status: DC | PRN

## 2016-10-03 MED ORDER — DEXAMETHASONE SODIUM PHOSPHATE 4 MG/ML IJ SOLN
INTRAMUSCULAR | Status: DC | PRN
  Administered 2016-10-03: 10 mg via INTRAVENOUS

## 2016-10-03 MED ORDER — LIDOCAINE-EPINEPHRINE 1 %-1:100000 IJ SOLN
INTRAMUSCULAR | Status: DC | PRN
  Administered 2016-10-03: 5 mL

## 2016-10-03 MED ORDER — DIPHENHYDRAMINE HCL 50 MG/ML IJ SOLN: 12.5000 mg | Freq: Four times a day (QID) | INTRAMUSCULAR | Status: DC | PRN

## 2016-10-03 MED ORDER — MIDAZOLAM HCL 2 MG/2ML IJ SOLN
1.0000 mg | INTRAMUSCULAR | Status: DC | PRN
  Administered 2016-10-03: 2 mg via INTRAVENOUS

## 2016-10-03 MED ORDER — LIDOCAINE 2% (20 MG/ML) 5 ML SYRINGE
INTRAMUSCULAR | Status: DC | PRN
  Administered 2016-10-03: 60 mg via INTRAVENOUS

## 2016-10-03 MED ORDER — SODIUM CHLORIDE 0.9% FLUSH: 3.0000 mL | INTRAVENOUS | Status: DC | PRN

## 2016-10-03 MED ORDER — ONDANSETRON HCL 4 MG/2ML IJ SOLN
INTRAMUSCULAR | Status: DC | PRN
  Administered 2016-10-03: 4 mg via INTRAVENOUS

## 2016-10-03 MED ORDER — CIPROFLOXACIN IN D5W 400 MG/200ML IV SOLN
400.0000 mg | INTRAVENOUS | Status: AC
  Administered 2016-10-03: 400 mg via INTRAVENOUS

## 2016-10-03 MED ORDER — SODIUM CHLORIDE 0.9% FLUSH: 3.0000 mL | Freq: Two times a day (BID) | INTRAVENOUS | Status: DC

## 2016-10-03 MED ORDER — FENTANYL CITRATE (PF) 100 MCG/2ML IJ SOLN
50.0000 ug | INTRAMUSCULAR | Status: DC | PRN
  Administered 2016-10-03: 100 ug via INTRAVENOUS
  Administered 2016-10-03: 50 ug via INTRAVENOUS

## 2016-10-03 MED ORDER — BISACODYL 5 MG PO TBEC: 5.0000 mg | DELAYED_RELEASE_TABLET | Freq: Every day | ORAL | Status: DC | PRN

## 2016-10-03 MED ORDER — CIPROFLOXACIN IN D5W 400 MG/200ML IV SOLN
400.0000 mg | Freq: Two times a day (BID) | INTRAVENOUS | Status: DC
  Administered 2016-10-03: 400 mg via INTRAVENOUS
  Filled 2016-10-03: qty 200

## 2016-10-03 MED ORDER — LACTATED RINGERS IV SOLN
INTRAVENOUS | Status: DC
  Administered 2016-10-03: 11:00:00 via INTRAVENOUS

## 2016-10-03 MED ORDER — THROMBIN 5000 UNITS EX SOLR
CUTANEOUS | Status: AC
  Filled 2016-10-03: qty 5000

## 2016-10-03 MED ORDER — LIDOCAINE 2% (20 MG/ML) 5 ML SYRINGE
INTRAMUSCULAR | Status: AC
  Filled 2016-10-03: qty 5

## 2016-10-03 MED ORDER — HYDROMORPHONE HCL 1 MG/ML IJ SOLN
0.2500 mg | INTRAMUSCULAR | Status: DC | PRN
Start: 2016-10-03 — End: 2016-10-03
  Administered 2016-10-03 (×2): 0.5 mg via INTRAVENOUS

## 2016-10-03 MED ORDER — HYDROMORPHONE HCL 1 MG/ML IJ SOLN
1.0000 mg | INTRAMUSCULAR | Status: DC | PRN
Start: 2016-10-03 — End: 2016-10-04
  Administered 2016-10-03: 1 mg via INTRAVENOUS
  Filled 2016-10-03: qty 1

## 2016-10-03 MED ORDER — ONDANSETRON HCL 4 MG/2ML IJ SOLN
INTRAMUSCULAR | Status: AC
  Filled 2016-10-03: qty 2

## 2016-10-03 MED ORDER — LACTATED RINGERS IV SOLN
INTRAVENOUS | Status: DC
  Administered 2016-10-03: 16:00:00 via INTRAVENOUS

## 2016-10-03 MED ORDER — ACETAMINOPHEN 325 MG PO TABS: 650.0000 mg | ORAL_TABLET | ORAL | Status: DC | PRN

## 2016-10-03 MED ORDER — ONDANSETRON 4 MG PO TBDP
4.0000 mg | ORAL_TABLET | Freq: Four times a day (QID) | ORAL | Status: DC | PRN
  Administered 2016-10-03: 4 mg via ORAL
  Filled 2016-10-03: qty 1

## 2016-10-03 MED ORDER — HYDROCODONE-ACETAMINOPHEN 5-325 MG PO TABS
1.0000 | ORAL_TABLET | ORAL | Status: DC | PRN
  Administered 2016-10-04: 1 via ORAL
  Filled 2016-10-03: qty 1

## 2016-10-03 MED ORDER — POLYETHYLENE GLYCOL 3350 17 G PO PACK: 17.0000 g | PACK | Freq: Every day | ORAL | Status: DC | PRN

## 2016-10-03 MED ORDER — ACETAMINOPHEN 500 MG PO TABS
1000.0000 mg | ORAL_TABLET | Freq: Four times a day (QID) | ORAL | Status: DC
Start: 2016-10-03 — End: 2016-10-04
  Administered 2016-10-03 (×2): 1000 mg via ORAL
  Filled 2016-10-03 (×2): qty 2

## 2016-10-03 MED ORDER — SUCCINYLCHOLINE CHLORIDE 200 MG/10ML IV SOSY
PREFILLED_SYRINGE | INTRAVENOUS | Status: AC
  Filled 2016-10-03: qty 10

## 2016-10-03 MED ORDER — DEXAMETHASONE SODIUM PHOSPHATE 10 MG/ML IJ SOLN
INTRAMUSCULAR | Status: AC
  Filled 2016-10-03: qty 1

## 2016-10-03 MED ORDER — PROPOFOL 10 MG/ML IV BOLUS
INTRAVENOUS | Status: DC | PRN
  Administered 2016-10-03: 130 mg via INTRAVENOUS

## 2016-10-03 MED ORDER — HYDROMORPHONE HCL 1 MG/ML IJ SOLN
INTRAMUSCULAR | Status: AC
  Filled 2016-10-03: qty 0.5

## 2016-10-03 MED ORDER — ACETAMINOPHEN 650 MG RE SUPP: 650.0000 mg | RECTAL | Status: DC | PRN

## 2016-10-03 MED ORDER — LIDOCAINE HCL (PF) 1 % IJ SOLN
INTRAMUSCULAR | Status: AC
Start: 2016-10-03 — End: ?
  Filled 2016-10-03: qty 60

## 2016-10-03 MED ORDER — KCL IN DEXTROSE-NACL 20-5-0.45 MEQ/L-%-% IV SOLN
INTRAVENOUS | Status: DC
  Administered 2016-10-03: 18:00:00 via INTRAVENOUS
  Filled 2016-10-03 (×2): qty 1000

## 2016-10-03 MED ORDER — CIPROFLOXACIN IN D5W 400 MG/200ML IV SOLN
INTRAVENOUS | Status: AC
  Filled 2016-10-03: qty 200

## 2016-10-03 MED ORDER — SUCCINYLCHOLINE CHLORIDE 200 MG/10ML IV SOSY
PREFILLED_SYRINGE | INTRAVENOUS | Status: DC | PRN
  Administered 2016-10-03: 100 mg via INTRAVENOUS

## 2016-10-03 MED ORDER — DIPHENHYDRAMINE HCL 12.5 MG/5ML PO ELIX: 12.5000 mg | ORAL_SOLUTION | Freq: Four times a day (QID) | ORAL | Status: DC | PRN

## 2016-10-03 MED ORDER — IBUPROFEN 600 MG PO TABS: 600.0000 mg | ORAL_TABLET | Freq: Four times a day (QID) | ORAL | Status: DC | PRN

## 2016-10-03 MED ORDER — SODIUM CHLORIDE 0.9 % IV SOLN
250.0000 mL | INTRAVENOUS | Status: DC | PRN
Start: 2016-10-03 — End: 2016-10-03

## 2016-10-03 SURGICAL SUPPLY — 108 items
ADH SKN CLS APL DERMABOND .7 (GAUZE/BANDAGES/DRESSINGS) ×2
APL SKNCLS STERI-STRIP NONHPOA (GAUZE/BANDAGES/DRESSINGS)
BAG DECANTER FOR FLEXI CONT (MISCELLANEOUS) ×1 IMPLANT
BENZOIN TINCTURE PRP APPL 2/3 (GAUZE/BANDAGES/DRESSINGS) IMPLANT
BINDER ABDOMINAL  9 SM 30-45 (SOFTGOODS) ×1
BINDER ABDOMINAL 10 UNV 27-48 (MISCELLANEOUS) IMPLANT
BINDER ABDOMINAL 12 ML 46-62 (SOFTGOODS) IMPLANT
BINDER ABDOMINAL 9 SM 30-45 (SOFTGOODS) ×1 IMPLANT
BINDER BREAST LRG (GAUZE/BANDAGES/DRESSINGS) IMPLANT
BINDER BREAST MEDIUM (GAUZE/BANDAGES/DRESSINGS) IMPLANT
BINDER BREAST XLRG (GAUZE/BANDAGES/DRESSINGS) IMPLANT
BINDER BREAST XXLRG (GAUZE/BANDAGES/DRESSINGS) IMPLANT
BIOPATCH RED 1 DISK 7.0 (GAUZE/BANDAGES/DRESSINGS) ×2 IMPLANT
BLADE CLIPPER SURG (BLADE) IMPLANT
BLADE HEX COATED 2.75 (ELECTRODE) ×3 IMPLANT
BLADE SURG 10 STRL SS (BLADE) ×2 IMPLANT
BLADE SURG 15 STRL LF DISP TIS (BLADE) ×2 IMPLANT
BLADE SURG 15 STRL SS (BLADE) ×3
BNDG CONFORM 2 STRL LF (GAUZE/BANDAGES/DRESSINGS) IMPLANT
BNDG ELASTIC 2X5.8 VLCR STR LF (GAUZE/BANDAGES/DRESSINGS) IMPLANT
BNDG GAUZE ELAST 4 BULKY (GAUZE/BANDAGES/DRESSINGS) ×2 IMPLANT
CANISTER SUCT 1200ML W/VALVE (MISCELLANEOUS) ×3 IMPLANT
CHLORAPREP W/TINT 26ML (MISCELLANEOUS) ×3 IMPLANT
CLEANER CAUTERY TIP 5X5 PAD (MISCELLANEOUS) IMPLANT
CORD BIPOLAR FORCEPS 12FT (ELECTRODE) IMPLANT
COVER BACK TABLE 60X90IN (DRAPES) ×3 IMPLANT
COVER MAYO STAND STRL (DRAPES) ×3 IMPLANT
DECANTER SPIKE VIAL GLASS SM (MISCELLANEOUS) IMPLANT
DERMABOND ADVANCED (GAUZE/BANDAGES/DRESSINGS) ×1
DERMABOND ADVANCED .7 DNX12 (GAUZE/BANDAGES/DRESSINGS) ×1 IMPLANT
DRAIN CHANNEL 19F RND (DRAIN) ×2 IMPLANT
DRAPE LAPAROSCOPIC ABDOMINAL (DRAPES) ×3 IMPLANT
DRAPE LAPAROTOMY 100X72 PEDS (DRAPES) IMPLANT
DRAPE U-SHAPE 76X120 STRL (DRAPES) ×1 IMPLANT
DRSG MEPILEX BORDER 4X8 (GAUZE/BANDAGES/DRESSINGS) ×2 IMPLANT
DRSG PAD ABDOMINAL 8X10 ST (GAUZE/BANDAGES/DRESSINGS) ×6 IMPLANT
DRSG TEGADERM 2-3/8X2-3/4 SM (GAUZE/BANDAGES/DRESSINGS) IMPLANT
DRSG TEGADERM 4X4.75 (GAUZE/BANDAGES/DRESSINGS) IMPLANT
ELECT BLADE 4.0 EZ CLEAN MEGAD (MISCELLANEOUS) ×3
ELECT COATED BLADE 2.86 ST (ELECTRODE) IMPLANT
ELECT NDL BLADE 2-5/6 (NEEDLE) IMPLANT
ELECT NEEDLE BLADE 2-5/6 (NEEDLE) IMPLANT
ELECT REM PT RETURN 9FT ADLT (ELECTROSURGICAL) ×3
ELECT REM PT RETURN 9FT PED (ELECTROSURGICAL)
ELECTRODE BLDE 4.0 EZ CLN MEGD (MISCELLANEOUS) ×1 IMPLANT
ELECTRODE REM PT RETRN 9FT PED (ELECTROSURGICAL) IMPLANT
ELECTRODE REM PT RTRN 9FT ADLT (ELECTROSURGICAL) ×2 IMPLANT
EVACUATOR SILICONE 100CC (DRAIN) ×2 IMPLANT
GAUZE SPONGE 4X4 12PLY STRL LF (GAUZE/BANDAGES/DRESSINGS) IMPLANT
GLOVE BIO SURGEON STRL SZ 6.5 (GLOVE) ×6 IMPLANT
GLOVE BIOGEL PI IND STRL 7.0 (GLOVE) ×1 IMPLANT
GLOVE BIOGEL PI INDICATOR 7.0 (GLOVE) ×1
GLOVE ECLIPSE 6.5 STRL STRAW (GLOVE) ×2 IMPLANT
GOWN STRL REUS W/ TWL LRG LVL3 (GOWN DISPOSABLE) ×5 IMPLANT
GOWN STRL REUS W/ TWL XL LVL3 (GOWN DISPOSABLE) ×1 IMPLANT
GOWN STRL REUS W/TWL LRG LVL3 (GOWN DISPOSABLE) ×9
GOWN STRL REUS W/TWL XL LVL3 (GOWN DISPOSABLE) ×3
LINER CANISTER 1000CC FLEX (MISCELLANEOUS) ×3 IMPLANT
NDL HYPO 30GX1 BEV (NEEDLE) IMPLANT
NDL PRECISIONGLIDE 27X1.5 (NEEDLE) ×1 IMPLANT
NDL SAFETY ECLIPSE 18X1.5 (NEEDLE) ×2 IMPLANT
NEEDLE HYPO 18GX1.5 SHARP (NEEDLE) ×3
NEEDLE HYPO 30GX1 BEV (NEEDLE) IMPLANT
NEEDLE PRECISIONGLIDE 27X1.5 (NEEDLE) ×3 IMPLANT
NS IRRIG 1000ML POUR BTL (IV SOLUTION) ×2 IMPLANT
PACK BASIN DAY SURGERY FS (CUSTOM PROCEDURE TRAY) ×3 IMPLANT
PAD ALCOHOL SWAB (MISCELLANEOUS) ×3 IMPLANT
PAD CLEANER CAUTERY TIP 5X5 (MISCELLANEOUS)
PENCIL BUTTON HOLSTER BLD 10FT (ELECTRODE) ×3 IMPLANT
PIN SAFETY STERILE (MISCELLANEOUS) ×2 IMPLANT
RUBBERBAND STERILE (MISCELLANEOUS) IMPLANT
SHEET MEDIUM DRAPE 40X70 STRL (DRAPES) IMPLANT
SLEEVE SCD COMPRESS KNEE MED (MISCELLANEOUS) ×3 IMPLANT
SPONGE GAUZE 2X2 8PLY STRL LF (GAUZE/BANDAGES/DRESSINGS) IMPLANT
SPONGE LAP 18X18 X RAY DECT (DISPOSABLE) ×5 IMPLANT
STRIP CLOSURE SKIN 1/2X4 (GAUZE/BANDAGES/DRESSINGS) IMPLANT
SUCTION FRAZIER HANDLE 10FR (MISCELLANEOUS)
SUCTION TUBE FRAZIER 10FR DISP (MISCELLANEOUS) IMPLANT
SUT MNCRL 6-0 UNDY P1 1X18 (SUTURE) IMPLANT
SUT MNCRL AB 3-0 PS2 18 (SUTURE) ×2 IMPLANT
SUT MNCRL AB 4-0 PS2 18 (SUTURE) ×4 IMPLANT
SUT MON AB 3-0 SH 27 (SUTURE) ×3
SUT MON AB 3-0 SH27 (SUTURE) ×1 IMPLANT
SUT MON AB 5-0 P3 18 (SUTURE) IMPLANT
SUT MON AB 5-0 PS2 18 (SUTURE) ×2 IMPLANT
SUT MONOCRYL 6-0 P1 1X18 (SUTURE)
SUT PROLENE 5 0 P 3 (SUTURE) IMPLANT
SUT PROLENE 5 0 PS 2 (SUTURE) IMPLANT
SUT PROLENE 6 0 P 1 18 (SUTURE) IMPLANT
SUT SILK 3 0 PS 1 (SUTURE) ×2 IMPLANT
SUT VIC AB 3-0 SH 27 (SUTURE)
SUT VIC AB 3-0 SH 27X BRD (SUTURE) IMPLANT
SUT VIC AB 5-0 P-3 18X BRD (SUTURE) IMPLANT
SUT VIC AB 5-0 P3 18 (SUTURE)
SUT VIC AB 5-0 PS2 18 (SUTURE) IMPLANT
SUT VICRYL 4-0 PS2 18IN ABS (SUTURE) IMPLANT
SYR 50ML LL SCALE MARK (SYRINGE) ×3 IMPLANT
SYR BULB 3OZ (MISCELLANEOUS) IMPLANT
SYR BULB IRRIGATION 50ML (SYRINGE) ×3 IMPLANT
SYR CONTROL 10ML LL (SYRINGE) ×3 IMPLANT
SYR TB 1ML LL NO SAFETY (SYRINGE) ×3 IMPLANT
TOWEL OR 17X24 6PK STRL BLUE (TOWEL DISPOSABLE) ×6 IMPLANT
TRAY DSU PREP LF (CUSTOM PROCEDURE TRAY) ×3 IMPLANT
TUBE CONNECTING 20X1/4 (TUBING) ×3 IMPLANT
TUBING INFILTRATION IT-10001 (TUBING) ×2 IMPLANT
TUBING SET GRADUATE ASPIR 12FT (MISCELLANEOUS) ×3 IMPLANT
UNDERPAD 30X30 (UNDERPADS AND DIAPERS) ×4 IMPLANT
YANKAUER SUCT BULB TIP NO VENT (SUCTIONS) ×3 IMPLANT

## 2016-10-03 NOTE — Anesthesia Postprocedure Evaluation (Signed)
Anesthesia Post Note  Patient: Deborah Maldonado  Procedure(s) Performed: Procedure(s) (LRB): EXCISION OF RIGHT GLUTEAL LIPOMA WITH PATH EVALUATION (Right)     Patient location during evaluation: PACU Anesthesia Type: General Level of consciousness: awake and alert Pain management: pain level controlled Vital Signs Assessment: post-procedure vital signs reviewed and stable Respiratory status: spontaneous breathing, nonlabored ventilation and respiratory function stable Cardiovascular status: blood pressure returned to baseline and stable Postop Assessment: no signs of nausea or vomiting Anesthetic complications: no    Last Vitals:  Vitals:   10/03/16 1400 10/03/16 1443  BP: 132/87 127/85  Pulse: 94 84  Resp: 16 18  Temp:  36.4 C    Last Pain:  Vitals:   10/03/16 1443  TempSrc: Axillary  PainSc: 4                  Idalis Hoelting,W. EDMOND

## 2016-10-03 NOTE — Op Note (Signed)
DATE OF OPERATION: 10/03/2016  LOCATION: [facility]  PREOPERATIVE DIAGNOSIS: lipoma of trunk  POSTOPERATIVE DIAGNOSIS: Same  PROCEDURE: Excision of lipoma of trunk 8 x 15 cm  SURGEON: Lynlee Stratton Sanger Azari Janssens, DO  ASSISTANT: Shawn Rayburn, PA  EBL: 50 cc  CONDITION: Stable  COMPLICATIONS: None  INDICATION: The patient, Deborah Maldonado, is a 51 y.o. female born on Oct 31, 1965, is here for treatment of a large lipoma of the trunk on the right buttock area.  It has been present for several months and seems to be getting larger and tender.   PROCEDURE DETAILS:  The patient was seen prior to surgery and marked.  The IV antibiotics were given. The patient was taken to the operating room and given a general anesthetic. A standard time out was performed and all information was confirmed by those in the room. SCDs were placed.   The right buttock area was prepped and draped in the usual sterile fashion.  Local was injected for intraoperative hemostasis and postoperative pain control.  The #10 blade was used to make a longitudinal incision 4 cm long.  Once the lipoma was identified there was a thick capsule and space found.  This was excised for the 8 x 15 cm lipoma with the capsule.  Hemostasis was achieved with electrocautery. A #19 drain was placed and secured to the skin with 3-0 Silk. The deep layers were closed with 3-0 Monocryl.  The 4-0 was used to close the dermis and skin.  Dermabond was applied with a sterile dressing. The 8 x 15 cm specimen was sent to pathology.  The patient was allowed to wake up and taken to recovery room in stable condition at the end of the case. The family was notified at the end of the case.

## 2016-10-03 NOTE — Interval H&P Note (Signed)
History and Physical Interval Note:  10/03/2016 11:57 AM  Deborah Maldonado  has presented today for surgery, with the diagnosis of LIPOMA OF TORSO  The various methods of treatment have been discussed with the patient and family. After consideration of risks, benefits and other options for treatment, the patient has consented to  Procedure(s): EXCISION OF RIGHT GLUTEAL LIPOMA WITH PATH EVALUATION (Right) LIPOSUCTION (Right) as a surgical intervention .  The patient's history has been reviewed, patient examined, no change in status, stable for surgery.  I have reviewed the patient's chart and labs.  Questions were answered to the patient's satisfaction.     Wallace Going

## 2016-10-03 NOTE — Anesthesia Preprocedure Evaluation (Addendum)
Anesthesia Evaluation  Patient identified by MRN, date of birth, ID band Patient awake    Reviewed: Allergy & Precautions, H&P , NPO status , Patient's Chart, lab work & pertinent test results  Airway Mallampati: II  TM Distance: >3 FB Neck ROM: Full    Dental no notable dental hx. (+) Teeth Intact, Dental Advisory Given   Pulmonary asthma , sleep apnea ,    Pulmonary exam normal breath sounds clear to auscultation       Cardiovascular hypertension, Pt. on medications  Rhythm:Regular Rate:Normal     Neuro/Psych  Headaches, Anxiety Depression Bipolar Disorder    GI/Hepatic Neg liver ROS, GERD  Medicated and Controlled,  Endo/Other  Hypothyroidism Morbid obesity  Renal/GU negative Renal ROS  negative genitourinary   Musculoskeletal  (+) Arthritis , Osteoarthritis,  Fibromyalgia -  Abdominal   Peds  Hematology negative hematology ROS (+)   Anesthesia Other Findings   Reproductive/Obstetrics negative OB ROS                            Anesthesia Physical Anesthesia Plan  ASA: III  Anesthesia Plan: General   Post-op Pain Management:    Induction: Intravenous  PONV Risk Score and Plan: 4 or greater and Ondansetron, Dexamethasone and Midazolam  Airway Management Planned: Oral ETT  Additional Equipment:   Intra-op Plan:   Post-operative Plan: Extubation in OR  Informed Consent: I have reviewed the patients History and Physical, chart, labs and discussed the procedure including the risks, benefits and alternatives for the proposed anesthesia with the patient or authorized representative who has indicated his/her understanding and acceptance.   Dental advisory given  Plan Discussed with: CRNA  Anesthesia Plan Comments:        Anesthesia Quick Evaluation

## 2016-10-03 NOTE — Discharge Instructions (Signed)
Breast Self-Awareness Breast self-awareness means:  Knowing how your breasts look.  Knowing how your breasts feel.  Checking your breasts every month for changes.  Telling your doctor if you notice a change in your breasts.  Breast self-awareness allows you to notice a breast problem early while it is still small. How to do a breast self-exam One way to learn what is normal for your breasts and to check for changes is to do a breast self-exam. To do a breast self-exam: Look for Changes  1. Take off all the clothes above your waist. 2. Stand in front of a mirror in a room with good lighting. 3. Put your hands on your hips. 4. Push your hands down. 5. Look at your breasts and nipples in the mirror to see if one breast or nipple looks different than the other. Check to see if: ? The shape of one breast is different. ? The size of one breast is different. ? There are wrinkles, dips, and bumps in one breast and not the other. 6. Look at each breast for changes in your skin, such as: ? Redness. ? Scaly areas. 7. Look for changes in your nipples, such as: ? Liquid around the nipples. ? Bleeding. ? Dimpling. ? Redness. ? A change in where the nipples are. Feel for Changes 1. Lie on your back on the floor. 2. Feel each breast. To do this, follow these steps: ? Pick a breast to feel. ? Put the arm closest to that breast above your head. ? Use your other arm to feel the nipple area of your breast. Feel the area with the pads of your three middle fingers by making small circles with your fingers. For the first circle, press lightly. For the second circle, press harder. For the third circle, press even harder. ? Keep making circles with your fingers at the light, harder, and even harder pressures as you move down your breast. Stop when you feel your ribs. ? Move your fingers a little toward the center of your body. ? Start making circles with your fingers again, this time going up  until you reach your collarbone. ? Keep making up and down circles until you reach your armpit. Remember to keep using the three pressures. ? Feel the other breast in the same way. 3. Sit or stand in the shower or tub. 4. With soapy water on your skin, feel each breast the same way you did in step 2, when you were lying on the floor. Write Down What You Find  After doing the self-exam, write down:  What is normal for each breast.  Any changes you find in each breast.  When you last had your period.  How often should I check my breasts? Check your breasts every month. If you are breastfeeding, the best time to check them is after you feed your baby or after you use a breast pump. If you get periods, the best time to check your breasts is 5-7 days after your period is over. When should I see my doctor? See your doctor if you notice:  A change in shape or size of your breasts or nipples.  A change in the skin of your breast or nipples, such as red or scaly skin.  Unusual fluid coming from your nipples.  A lump or thick area that was not there before.  Pain in your breasts.  Anything that concerns you.  This information is not intended to replace advice given  to you by your health care provider. Make sure you discuss any questions you have with your health care provider. Document Released: 08/08/2007 Document Revised: 07/28/2015 Document Reviewed: 01/09/2015 Elsevier Interactive Patient Education  2018 [employer].  Lipoma A lipoma is a noncancerous (benign) tumor that is made up of fat cells. This is a very common type of soft-tissue growth. Lipomas are usually found under the skin (subcutaneous). They may occur in any tissue of the body that contains fat. Common areas for lipomas to appear include the back, shoulders, buttocks, and thighs. Lipomas grow slowly, and they are usually painless. Most lipomas do not cause problems and do not require treatment. What are the  causes? The cause of this condition is not known. What increases the risk? This condition is more likely to develop in:  People who are 66-79 years old.  People who have a family history of lipomas.  What are the signs or symptoms? A lipoma usually appears as a small, round bump under the skin. It may feel soft or rubbery, but the firmness can vary. Most lipomas are not painful. However, a lipoma may become painful if it is located in an area where it pushes on nerves. How is this diagnosed? A lipoma can usually be diagnosed with a physical exam. You may also have tests to confirm the diagnosis and to rule out other conditions. Tests may include:  Imaging tests, such as a CT scan or MRI.  Removal of a tissue sample to be looked at under a microscope (biopsy).  How is this treated? Treatment is not needed for small lipomas that are not causing problems. If a lipoma continues to get bigger or it causes problems, removal is often the best option. Lipomas can also be removed to improve appearance. Removal of a lipoma is usually done with a surgery in which the fatty cells and the surrounding capsule are removed. Most often, a medicine that numbs the area (local anesthetic) is used for this procedure. Follow these instructions at home:  Keep all follow-up visits as directed by your health care provider. This is important. Contact a health care provider if:  Your lipoma becomes larger or hard.  Your lipoma becomes painful, red, or increasingly swollen. These could be signs of infection or a more serious condition. This information is not intended to replace advice given to you by your health care provider. Make sure you discuss any questions you have with your health care provider. Document Released: 02/09/2002 Document Revised: 07/28/2015 Document Reviewed: 02/15/2014 Elsevier Interactive Patient Education  2018 [employer]. May shower on Sunday Drain care        JP Drain  Totals  Bring this sheet to all of your post-operative appointments while you have your drains.  Please measure your drains by CC's or ML's.  Make sure you drain and measure your JP Drains 2 or 3 times per day.  At the end of each day, add up totals for the left side and add up totals for the right side.    ( 9 am )     ( 3 pm )        ( 9 pm )                Date L  R  L  R  L  R  Total L/R  Post Anesthesia Home Care Instructions  Activity: Get plenty of rest for the remainder of the day. A responsible individual must stay with you for 24 hours following the procedure.  For the next 24 hours, DO NOT: -Drive a car -Paediatric nurse -Drink alcoholic beverages -Take any medication unless instructed by your physician -Make any legal decisions or sign important papers.  Meals: Start with liquid foods such as gelatin or soup. Progress to regular foods as tolerated. Avoid greasy, spicy, heavy foods. If nausea and/or vomiting occur, drink only clear liquids until the nausea and/or vomiting subsides. Call your physician if vomiting continues.  Special Instructions/Symptoms: Your throat may feel dry or sore from the anesthesia or the breathing tube placed in your throat during surgery. If this causes discomfort, gargle with warm salt water. The discomfort should disappear within 24 hours.  If you had a scopolamine patch placed behind your ear for the management of post- operative nausea and/or vomiting:  1. The medication in the patch is effective for 72 hours, after which it should be removed.  Wrap patch in a tissue and discard in the trash. Wash hands thoroughly with soap and water. 2. You may remove the patch earlier than 72 hours if you experience unpleasant side effects which may include dry  mouth, dizziness or visual disturbances. 3. Avoid touching the patch. Wash your hands with soap and water after contact with the patch.

## 2016-10-03 NOTE — Transfer of Care (Signed)
Immediate Anesthesia Transfer of Care Note  Patient: Deborah Maldonado  Procedure(s) Performed: Procedure(s): EXCISION OF RIGHT GLUTEAL LIPOMA WITH PATH EVALUATION (Right)  Patient Location: PACU  Anesthesia Type:General  Level of Consciousness: awake, sedated and patient cooperative  Airway & Oxygen Therapy: Patient Spontanous Breathing and Patient connected to face mask oxygen  Post-op Assessment: Report given to RN and Post -op Vital signs reviewed and stable  Post vital signs: Reviewed and stable  Last Vitals:  Vitals:   10/03/16 1035 10/03/16 1321  BP: 116/80   Pulse: 71   Resp: 18   Temp: 36.7 C (P) 36.6 C    Last Pain:  Vitals:   10/03/16 1035  TempSrc: Oral      Patients Stated Pain Goal: 0 (54/49/20 1007)  Complications: No apparent anesthesia complications

## 2016-10-03 NOTE — Anesthesia Procedure Notes (Signed)
Procedure Name: Intubation Date/Time: 10/03/2016 12:17 PM Performed by: Lyndee Leo Pre-anesthesia Checklist: Patient identified, Emergency Drugs available, Suction available and Patient being monitored Patient Re-evaluated:Patient Re-evaluated prior to induction Oxygen Delivery Method: Circle system utilized Preoxygenation: Pre-oxygenation with 100% oxygen Induction Type: IV induction Ventilation: Mask ventilation without difficulty Laryngoscope Size: Mac and 3 Grade View: Grade I Tube type: Oral Tube size: 7.0 mm Number of attempts: 1 Airway Equipment and Method: Stylet and Oral airway Placement Confirmation: ETT inserted through vocal cords under direct vision,  positive ETCO2 and breath sounds checked- equal and bilateral Secured at: 20 cm Tube secured with: Tape Dental Injury: Teeth and Oropharynx as per pre-operative assessment

## 2016-10-04 ENCOUNTER — Encounter (HOSPITAL_BASED_OUTPATIENT_CLINIC_OR_DEPARTMENT_OTHER): Payer: Self-pay | Admitting: Plastic Surgery

## 2016-10-04 DIAGNOSIS — M1990 Unspecified osteoarthritis, unspecified site: Secondary | ICD-10-CM | POA: Diagnosis not present

## 2016-10-04 DIAGNOSIS — I10 Essential (primary) hypertension: Secondary | ICD-10-CM | POA: Diagnosis not present

## 2016-10-04 DIAGNOSIS — Z885 Allergy status to narcotic agent status: Secondary | ICD-10-CM | POA: Diagnosis not present

## 2016-10-04 DIAGNOSIS — J45909 Unspecified asthma, uncomplicated: Secondary | ICD-10-CM | POA: Diagnosis not present

## 2016-10-04 DIAGNOSIS — D171 Benign lipomatous neoplasm of skin and subcutaneous tissue of trunk: Secondary | ICD-10-CM | POA: Diagnosis not present

## 2016-10-04 DIAGNOSIS — Z79899 Other long term (current) drug therapy: Secondary | ICD-10-CM | POA: Diagnosis not present

## 2016-10-04 DIAGNOSIS — F329 Major depressive disorder, single episode, unspecified: Secondary | ICD-10-CM | POA: Diagnosis not present

## 2016-10-04 DIAGNOSIS — G4730 Sleep apnea, unspecified: Secondary | ICD-10-CM | POA: Diagnosis not present

## 2016-10-04 DIAGNOSIS — F319 Bipolar disorder, unspecified: Secondary | ICD-10-CM | POA: Diagnosis not present

## 2016-10-04 DIAGNOSIS — E039 Hypothyroidism, unspecified: Secondary | ICD-10-CM | POA: Diagnosis not present

## 2016-10-04 DIAGNOSIS — M797 Fibromyalgia: Secondary | ICD-10-CM | POA: Diagnosis not present

## 2016-10-04 DIAGNOSIS — G4700 Insomnia, unspecified: Secondary | ICD-10-CM | POA: Diagnosis not present

## 2016-10-04 DIAGNOSIS — F419 Anxiety disorder, unspecified: Secondary | ICD-10-CM | POA: Diagnosis not present

## 2016-10-04 DIAGNOSIS — K219 Gastro-esophageal reflux disease without esophagitis: Secondary | ICD-10-CM | POA: Diagnosis not present

## 2016-10-04 DIAGNOSIS — Z96641 Presence of right artificial hip joint: Secondary | ICD-10-CM | POA: Diagnosis not present

## 2016-10-04 NOTE — Discharge Summary (Signed)
Physician Discharge Summary  Patient ID: Deborah Maldonado MRN: [id number] DOB/AGE: December 15, 1965 51 y.o.  Admit date: 10/03/2016 Discharge date: 10/04/2016  Admission Diagnoses:  Discharge Diagnoses:  Active Problems:   Lipoma   Discharged Condition: good  Hospital Course: Underwent surgery for excision of lipoma.  Stayed overnight for observation.  Doing very well. Discharged to home.  Consults: none  Significant Diagnostic Studies: none  Treatments: surgery  Discharge Exam: Blood pressure 123/82, pulse 81, temperature (!) 97.4 F (36.3 C), resp. rate 16, height 5\' 7"  (1.702 m), weight 104.8 kg (231 lb), SpO2 97 %. General appearance: alert, cooperative and no distress Incision/Wound:  Disposition: 01-Home or Self Care  Discharge Instructions    Call MD for:  difficulty breathing, headache or visual disturbances    Complete by:  As directed    Call MD for:  persistant nausea and vomiting    Complete by:  As directed    Call MD for:  redness, tenderness, or signs of infection (pain, swelling, redness, odor or green/yellow discharge around incision site)    Complete by:  As directed    Call MD for:  severe uncontrolled pain    Complete by:  As directed    Call MD for:  temperature >100.4    Complete by:  As directed    Diet general    Complete by:  As directed    Discharge wound care:    Complete by:  As directed    Drain care   Driving Restrictions    Complete by:  As directed    No driving while on pain meds   Increase activity slowly    Complete by:  As directed    Lifting restrictions    Complete by:  As directed    No heavy lifting     Allergies as of 10/04/2016      Reactions   Penicillins Anaphylaxis   Morphine And Related    Bad headache      Medication List    TAKE these medications   ALBUTEROL SULFATE IN Take 2.5 mg by nebulization every 6 (six) hours as needed for wheezing or shortness of breath.   atorvastatin 20 MG tablet Commonly known as:   LIPITOR Take 20 mg by mouth daily.   budesonide-formoterol 160-4.5 MCG/ACT inhaler Commonly known as:  SYMBICORT Inhale 2 puffs into the lungs 2 (two) times daily.   DEXILANT 60 MG capsule Generic drug:  dexlansoprazole Take 60 mg by mouth daily.   diclofenac 75 MG EC tablet Commonly known as:  VOLTAREN Take 75 mg by mouth 2 (two) times daily.   DYMISTA 137-50 MCG/ACT Susp Generic drug:  Azelastine-Fluticasone Place 1 spray into the nose 2 (two) times daily.   estradiol 0.5 MG tablet Commonly known as:  ESTRACE Take 0.5 mg by mouth daily.   furosemide 40 MG tablet Commonly known as:  LASIX Take 40 mg by mouth as needed.   lamoTRIgine 100 MG tablet Commonly known as:  LAMICTAL Take 100 mg by mouth daily.   levothyroxine 150 MCG tablet Commonly known as:  SYNTHROID, LEVOTHROID Take 150 mcg by mouth daily before breakfast.   losartan-hydrochlorothiazide 100-25 MG tablet Commonly known as:  HYZAAR Take 1 tablet by mouth daily.   potassium chloride SA 20 MEQ tablet Commonly known as:  K-DUR,KLOR-CON Take 40 mEq by mouth 2 (two) times daily.   pregabalin 225 MG capsule Commonly known as:  LYRICA Take 225 mg by mouth 2 (two) times daily.  VRAYLAR 3 MG Caps Generic drug:  Cariprazine HCl Take by mouth.      Follow-up Information    Jamichael Knotts, Loel Lofty, DO In 1 week.   Specialty:  Plastic Surgery Contact information: [place] [address] [phone]           Signed: Wallace Going 10/04/2016, 7:20 AM

## 2016-10-11 DIAGNOSIS — J340 Abscess, furuncle and carbuncle of nose: Secondary | ICD-10-CM | POA: Diagnosis not present

## 2016-10-11 DIAGNOSIS — J3489 Other specified disorders of nose and nasal sinuses: Secondary | ICD-10-CM | POA: Diagnosis not present

## 2016-10-11 DIAGNOSIS — J320 Chronic maxillary sinusitis: Secondary | ICD-10-CM | POA: Diagnosis not present

## 2016-10-15 DIAGNOSIS — Z22322 Carrier or suspected carrier of Methicillin resistant Staphylococcus aureus: Secondary | ICD-10-CM | POA: Diagnosis not present

## 2016-10-15 DIAGNOSIS — Z6836 Body mass index (BMI) 36.0-36.9, adult: Secondary | ICD-10-CM | POA: Diagnosis not present

## 2016-10-15 DIAGNOSIS — A4902 Methicillin resistant Staphylococcus aureus infection, unspecified site: Secondary | ICD-10-CM | POA: Diagnosis not present

## 2016-10-15 DIAGNOSIS — K1379 Other lesions of oral mucosa: Secondary | ICD-10-CM | POA: Diagnosis not present

## 2016-10-17 DIAGNOSIS — M545 Low back pain: Secondary | ICD-10-CM | POA: Diagnosis not present

## 2016-10-17 DIAGNOSIS — M542 Cervicalgia: Secondary | ICD-10-CM | POA: Diagnosis not present

## 2016-10-17 DIAGNOSIS — M9901 Segmental and somatic dysfunction of cervical region: Secondary | ICD-10-CM | POA: Diagnosis not present

## 2016-10-17 DIAGNOSIS — M546 Pain in thoracic spine: Secondary | ICD-10-CM | POA: Diagnosis not present

## 2016-10-18 DIAGNOSIS — F3174 Bipolar disorder, in full remission, most recent episode manic: Secondary | ICD-10-CM | POA: Diagnosis not present

## 2016-10-18 DIAGNOSIS — L814 Other melanin hyperpigmentation: Secondary | ICD-10-CM | POA: Diagnosis not present

## 2016-10-30 DIAGNOSIS — M4722 Other spondylosis with radiculopathy, cervical region: Secondary | ICD-10-CM | POA: Diagnosis not present

## 2016-10-30 DIAGNOSIS — M50021 Cervical disc disorder at C4-C5 level with myelopathy: Secondary | ICD-10-CM | POA: Diagnosis not present

## 2016-10-30 DIAGNOSIS — Z79899 Other long term (current) drug therapy: Secondary | ICD-10-CM | POA: Diagnosis not present

## 2016-10-30 DIAGNOSIS — M542 Cervicalgia: Secondary | ICD-10-CM | POA: Diagnosis not present

## 2016-10-30 DIAGNOSIS — M50022 Cervical disc disorder at C5-C6 level with myelopathy: Secondary | ICD-10-CM | POA: Diagnosis not present

## 2016-11-08 DIAGNOSIS — J320 Chronic maxillary sinusitis: Secondary | ICD-10-CM | POA: Diagnosis not present

## 2016-11-08 DIAGNOSIS — J3489 Other specified disorders of nose and nasal sinuses: Secondary | ICD-10-CM | POA: Diagnosis not present

## 2016-11-09 DIAGNOSIS — M50021 Cervical disc disorder at C4-C5 level with myelopathy: Secondary | ICD-10-CM | POA: Diagnosis not present

## 2016-11-09 DIAGNOSIS — M50022 Cervical disc disorder at C5-C6 level with myelopathy: Secondary | ICD-10-CM | POA: Diagnosis not present

## 2016-11-09 DIAGNOSIS — M542 Cervicalgia: Secondary | ICD-10-CM | POA: Diagnosis not present

## 2016-11-14 DIAGNOSIS — E876 Hypokalemia: Secondary | ICD-10-CM | POA: Diagnosis not present

## 2016-11-14 DIAGNOSIS — E785 Hyperlipidemia, unspecified: Secondary | ICD-10-CM | POA: Diagnosis not present

## 2016-11-14 DIAGNOSIS — Z6836 Body mass index (BMI) 36.0-36.9, adult: Secondary | ICD-10-CM | POA: Diagnosis not present

## 2016-11-14 DIAGNOSIS — Z1389 Encounter for screening for other disorder: Secondary | ICD-10-CM | POA: Diagnosis not present

## 2016-11-14 DIAGNOSIS — D509 Iron deficiency anemia, unspecified: Secondary | ICD-10-CM | POA: Diagnosis not present

## 2016-11-14 DIAGNOSIS — E039 Hypothyroidism, unspecified: Secondary | ICD-10-CM | POA: Diagnosis not present

## 2016-11-16 DIAGNOSIS — G4733 Obstructive sleep apnea (adult) (pediatric): Secondary | ICD-10-CM | POA: Diagnosis not present

## 2016-11-19 DIAGNOSIS — Z01812 Encounter for preprocedural laboratory examination: Secondary | ICD-10-CM | POA: Diagnosis not present

## 2016-11-20 DIAGNOSIS — F3131 Bipolar disorder, current episode depressed, mild: Secondary | ICD-10-CM | POA: Diagnosis not present

## 2016-11-20 DIAGNOSIS — G4733 Obstructive sleep apnea (adult) (pediatric): Secondary | ICD-10-CM | POA: Diagnosis not present

## 2016-11-20 DIAGNOSIS — J301 Allergic rhinitis due to pollen: Secondary | ICD-10-CM | POA: Diagnosis not present

## 2016-11-20 DIAGNOSIS — J4520 Mild intermittent asthma, uncomplicated: Secondary | ICD-10-CM | POA: Diagnosis not present

## 2016-11-20 DIAGNOSIS — H50112 Monocular exotropia, left eye: Secondary | ICD-10-CM | POA: Diagnosis not present

## 2016-11-27 DIAGNOSIS — M4722 Other spondylosis with radiculopathy, cervical region: Secondary | ICD-10-CM | POA: Diagnosis not present

## 2016-11-27 DIAGNOSIS — M4712 Other spondylosis with myelopathy, cervical region: Secondary | ICD-10-CM | POA: Diagnosis not present

## 2016-11-27 DIAGNOSIS — M50022 Cervical disc disorder at C5-C6 level with myelopathy: Secondary | ICD-10-CM | POA: Diagnosis not present

## 2016-11-27 DIAGNOSIS — M50021 Cervical disc disorder at C4-C5 level with myelopathy: Secondary | ICD-10-CM | POA: Diagnosis not present

## 2016-11-27 DIAGNOSIS — M4322 Fusion of spine, cervical region: Secondary | ICD-10-CM | POA: Diagnosis not present

## 2016-11-28 DIAGNOSIS — M4322 Fusion of spine, cervical region: Secondary | ICD-10-CM | POA: Diagnosis not present

## 2016-11-28 DIAGNOSIS — M4712 Other spondylosis with myelopathy, cervical region: Secondary | ICD-10-CM | POA: Diagnosis not present

## 2016-11-28 DIAGNOSIS — M4722 Other spondylosis with radiculopathy, cervical region: Secondary | ICD-10-CM | POA: Diagnosis not present

## 2016-11-28 DIAGNOSIS — M50021 Cervical disc disorder at C4-C5 level with myelopathy: Secondary | ICD-10-CM | POA: Diagnosis not present

## 2016-12-11 DIAGNOSIS — D509 Iron deficiency anemia, unspecified: Secondary | ICD-10-CM | POA: Diagnosis not present

## 2016-12-17 DIAGNOSIS — F3174 Bipolar disorder, in full remission, most recent episode manic: Secondary | ICD-10-CM | POA: Diagnosis not present

## 2016-12-18 DIAGNOSIS — D509 Iron deficiency anemia, unspecified: Secondary | ICD-10-CM | POA: Diagnosis not present

## 2016-12-25 DIAGNOSIS — J343 Hypertrophy of nasal turbinates: Secondary | ICD-10-CM | POA: Diagnosis not present

## 2016-12-25 DIAGNOSIS — J3489 Other specified disorders of nose and nasal sinuses: Secondary | ICD-10-CM | POA: Diagnosis not present

## 2016-12-25 DIAGNOSIS — J342 Deviated nasal septum: Secondary | ICD-10-CM | POA: Diagnosis not present

## 2016-12-26 DIAGNOSIS — N390 Urinary tract infection, site not specified: Secondary | ICD-10-CM | POA: Diagnosis not present

## 2016-12-26 DIAGNOSIS — D485 Neoplasm of uncertain behavior of skin: Secondary | ICD-10-CM | POA: Diagnosis not present

## 2016-12-26 DIAGNOSIS — Z6837 Body mass index (BMI) 37.0-37.9, adult: Secondary | ICD-10-CM | POA: Diagnosis not present

## 2016-12-31 DIAGNOSIS — M50021 Cervical disc disorder at C4-C5 level with myelopathy: Secondary | ICD-10-CM | POA: Diagnosis not present

## 2016-12-31 DIAGNOSIS — M50022 Cervical disc disorder at C5-C6 level with myelopathy: Secondary | ICD-10-CM | POA: Diagnosis not present

## 2017-01-15 DIAGNOSIS — F3131 Bipolar disorder, current episode depressed, mild: Secondary | ICD-10-CM | POA: Diagnosis not present

## 2017-01-16 DIAGNOSIS — J342 Deviated nasal septum: Secondary | ICD-10-CM | POA: Diagnosis not present

## 2017-01-16 DIAGNOSIS — J343 Hypertrophy of nasal turbinates: Secondary | ICD-10-CM | POA: Diagnosis not present

## 2017-01-16 DIAGNOSIS — J3489 Other specified disorders of nose and nasal sinuses: Secondary | ICD-10-CM | POA: Diagnosis not present

## 2017-01-22 DIAGNOSIS — J301 Allergic rhinitis due to pollen: Secondary | ICD-10-CM | POA: Diagnosis not present

## 2017-01-29 DIAGNOSIS — J329 Chronic sinusitis, unspecified: Secondary | ICD-10-CM | POA: Diagnosis not present

## 2017-01-29 DIAGNOSIS — J3489 Other specified disorders of nose and nasal sinuses: Secondary | ICD-10-CM | POA: Diagnosis not present

## 2017-01-29 DIAGNOSIS — J342 Deviated nasal septum: Secondary | ICD-10-CM | POA: Diagnosis not present

## 2017-02-05 DIAGNOSIS — L820 Inflamed seborrheic keratosis: Secondary | ICD-10-CM | POA: Diagnosis not present

## 2017-02-09 IMAGING — RF DG HIP (WITH PELVIS) OPERATIVE*R*
1 series · 1 of 1 positions shown · non-contrast
Comparison: 07/11/15

CLINICAL DATA: Hip Dislocation

EXAM:
OPERATIVE RIGHT HIP (WITH PELVIS IF PERFORMED) 1 VIEWS
TECHNIQUE: Fluoroscopic spot image(s) were submitted for interpretation
post-operatively.

[Series 1: run · 1 of 1 slices shown]
[im 1/1]
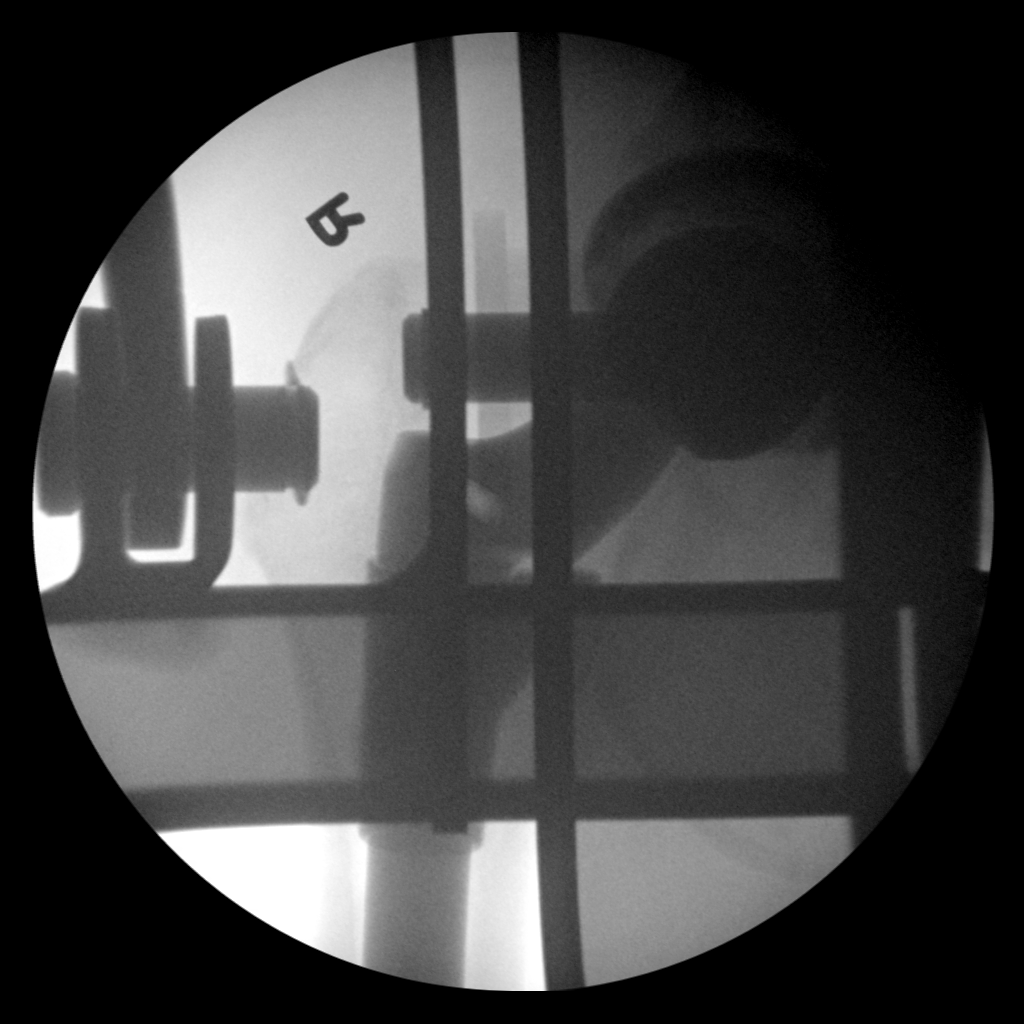

[1 of 1 positions shown; findings below may reference images not displayed]

FINDINGS: Satisfactory reduction.
IMPRESSION: As above.

## 2017-02-12 DIAGNOSIS — F3174 Bipolar disorder, in full remission, most recent episode manic: Secondary | ICD-10-CM | POA: Diagnosis not present

## 2017-02-13 DIAGNOSIS — E039 Hypothyroidism, unspecified: Secondary | ICD-10-CM | POA: Diagnosis not present

## 2017-02-13 DIAGNOSIS — Z23 Encounter for immunization: Secondary | ICD-10-CM | POA: Diagnosis not present

## 2017-02-13 DIAGNOSIS — D509 Iron deficiency anemia, unspecified: Secondary | ICD-10-CM | POA: Diagnosis not present

## 2017-02-13 DIAGNOSIS — E785 Hyperlipidemia, unspecified: Secondary | ICD-10-CM | POA: Diagnosis not present

## 2017-02-13 DIAGNOSIS — E876 Hypokalemia: Secondary | ICD-10-CM | POA: Diagnosis not present

## 2017-02-18 DIAGNOSIS — M50022 Cervical disc disorder at C5-C6 level with myelopathy: Secondary | ICD-10-CM | POA: Diagnosis not present

## 2017-02-18 DIAGNOSIS — H5005 Alternating esotropia: Secondary | ICD-10-CM | POA: Diagnosis not present

## 2017-02-18 DIAGNOSIS — M50021 Cervical disc disorder at C4-C5 level with myelopathy: Secondary | ICD-10-CM | POA: Diagnosis not present

## 2017-02-19 DIAGNOSIS — Z862 Personal history of diseases of the blood and blood-forming organs and certain disorders involving the immune mechanism: Secondary | ICD-10-CM | POA: Diagnosis not present

## 2017-02-19 DIAGNOSIS — D509 Iron deficiency anemia, unspecified: Secondary | ICD-10-CM | POA: Diagnosis not present

## 2017-02-21 ENCOUNTER — Encounter (HOSPITAL_BASED_OUTPATIENT_CLINIC_OR_DEPARTMENT_OTHER): Payer: Self-pay | Admitting: *Deleted

## 2017-02-21 ENCOUNTER — Other Ambulatory Visit: Payer: Self-pay

## 2017-02-21 NOTE — Progress Notes (Signed)
Pt states she had labs drawn last week at Dr. Gabriel Carina.  Dr. Nelda Bucks office [phone]) called to have results faxed

## 2017-02-25 ENCOUNTER — Ambulatory Visit: Payer: Self-pay | Admitting: Ophthalmology

## 2017-02-28 ENCOUNTER — Ambulatory Visit: Payer: Self-pay | Admitting: Ophthalmology

## 2017-02-28 NOTE — H&P (View-Only) (Signed)
Date of examination:  02-18-17  Indication for surgery: to straighten the eyes and relieve diplopia  Pertinent past medical history:  Past Medical History:  Diagnosis Date  . Anxiety   . Arthritis   . Asthma   . Back pain    facet joint encapsulated   . Bipolar disorder ([place])    takes Lithium daily  . Depression    takes VRAYLAR, SEROQUEL, LAMICTAL  . Fibromyalgia    takes Lyrica daily  . GERD (gastroesophageal reflux disease)    takes Dexilant daily  . Headache    occasionally  . History of bronchitis early 2016  . Hypertension    takes Hyzaar daily  . Hypothyroidism    takes Synthroid daily  . Insomnia    takes Triazolam nightly  . Joint pain   . MRSA (methicillin resistant staph aureus) culture positive    tested positive in may after sinus infection  . Pneumonia 2014  . Seasonal allergies    uses Dymista daily and takes Allegra daily  . Sleep apnea    has not used CPAP since sinus surgery 7mos ago  . Slow urinary stream    bladder stem stretched this year    Pertinent ocular history:  Diplopia due to consecutive esotropia following R&R OS for sensory XT 5/18.  Hx trauma OS '95, hx vitx no buckle  Pertinent family history:  Family History  Problem Relation Age of Onset  . Cancer Mother   . Hypertension Mother   . Cancer Father   . Hypertension Father   . Arthritis Father   . Heart failure Paternal Grandfather   . Heart attack Maternal Grandmother   . Cancer Maternal Grandfather   . Alzheimer's disease Paternal Grandmother     General:  Healthy appearing patient in no distress.    Eyes:    Acuity [place] OD 20/20  OS 20/CF at 2'  External: Within normal limits    Pupils:  Left APD   Anterior segment: Within normal limits OD x RK scars.  Healed conj scars OS. RK scars OS  Motility:   LET' 20 (K)  Fundus: deferred  Refraction:   OD +2.50 SE  OS -5 + 4 x 032  Heart: Regular rate and rhythm without murmur     Lungs: Clear to auscultation       Impression:   1.  Consecutive esotropia left eye with diplopia   2.  Hx trauma to left eye, + APD  Plan: Recess the previously resected left medial rectus muscle  Derry Skill

## 2017-02-28 NOTE — H&P (Signed)
Date of examination:  02-18-17  Indication for surgery: to straighten the eyes and relieve diplopia  Pertinent past medical history:  Past Medical History:  Diagnosis Date  . Anxiety   . Arthritis   . Asthma   . Back pain    facet joint encapsulated   . Bipolar disorder ([place])    takes Lithium daily  . Depression    takes VRAYLAR, SEROQUEL, LAMICTAL  . Fibromyalgia    takes Lyrica daily  . GERD (gastroesophageal reflux disease)    takes Dexilant daily  . Headache    occasionally  . History of bronchitis early 2016  . Hypertension    takes Hyzaar daily  . Hypothyroidism    takes Synthroid daily  . Insomnia    takes Triazolam nightly  . Joint pain   . MRSA (methicillin resistant staph aureus) culture positive    tested positive in may after sinus infection  . Pneumonia 2014  . Seasonal allergies    uses Dymista daily and takes Allegra daily  . Sleep apnea    has not used CPAP since sinus surgery 62mos ago  . Slow urinary stream    bladder stem stretched this year    Pertinent ocular history:  Diplopia due to consecutive esotropia following R&R OS for sensory XT 5/18.  Hx trauma OS '95, hx vitx no buckle  Pertinent family history:  Family History  Problem Relation Age of Onset  . Cancer Mother   . Hypertension Mother   . Cancer Father   . Hypertension Father   . Arthritis Father   . Heart failure Paternal Grandfather   . Heart attack Maternal Grandmother   . Cancer Maternal Grandfather   . Alzheimer's disease Paternal Grandmother     General:  Healthy appearing patient in no distress.    Eyes:    Acuity [place] OD 20/20  OS 20/CF at 2'  External: Within normal limits    Pupils:  Left APD   Anterior segment: Within normal limits OD x RK scars.  Healed conj scars OS. RK scars OS  Motility:   LET' 20 (K)  Fundus: deferred  Refraction:   OD +2.50 SE  OS -5 + 4 x 032  Heart: Regular rate and rhythm without murmur     Lungs: Clear to auscultation       Impression:   1.  Consecutive esotropia left eye with diplopia   2.  Hx trauma to left eye, + APD  Plan: Recess the previously resected left medial rectus muscle  Derry Skill

## 2017-03-01 ENCOUNTER — Ambulatory Visit (HOSPITAL_BASED_OUTPATIENT_CLINIC_OR_DEPARTMENT_OTHER)
Admission: RE | Admit: 2017-03-01 | Discharge: 2017-03-01 | Disposition: A | Discharge status: Home / Self Care | Payer: BLUE CROSS/BLUE SHIELD | Source: Ambulatory Visit | Attending: Ophthalmology | Admitting: Ophthalmology

## 2017-03-01 ENCOUNTER — Other Ambulatory Visit: Payer: Self-pay

## 2017-03-01 ENCOUNTER — Ambulatory Visit (HOSPITAL_BASED_OUTPATIENT_CLINIC_OR_DEPARTMENT_OTHER): Payer: BLUE CROSS/BLUE SHIELD | Admitting: Anesthesiology

## 2017-03-01 ENCOUNTER — Encounter (HOSPITAL_BASED_OUTPATIENT_CLINIC_OR_DEPARTMENT_OTHER): Payer: Self-pay | Admitting: *Deleted

## 2017-03-01 ENCOUNTER — Encounter (HOSPITAL_BASED_OUTPATIENT_CLINIC_OR_DEPARTMENT_OTHER)
Admission: RE | Disposition: A | Discharge status: Home / Self Care | Payer: Self-pay | Source: Ambulatory Visit | Attending: Ophthalmology

## 2017-03-01 DIAGNOSIS — H50012 Monocular esotropia, left eye: Secondary | ICD-10-CM | POA: Diagnosis not present

## 2017-03-01 DIAGNOSIS — E039 Hypothyroidism, unspecified: Secondary | ICD-10-CM | POA: Diagnosis not present

## 2017-03-01 DIAGNOSIS — Z79899 Other long term (current) drug therapy: Secondary | ICD-10-CM | POA: Diagnosis not present

## 2017-03-01 DIAGNOSIS — Z87828 Personal history of other (healed) physical injury and trauma: Secondary | ICD-10-CM | POA: Insufficient documentation

## 2017-03-01 DIAGNOSIS — Z820 Family history of epilepsy and other diseases of the nervous system: Secondary | ICD-10-CM | POA: Diagnosis not present

## 2017-03-01 DIAGNOSIS — M797 Fibromyalgia: Secondary | ICD-10-CM | POA: Diagnosis not present

## 2017-03-01 DIAGNOSIS — Z8249 Family history of ischemic heart disease and other diseases of the circulatory system: Secondary | ICD-10-CM | POA: Diagnosis not present

## 2017-03-01 DIAGNOSIS — Z9889 Other specified postprocedural states: Secondary | ICD-10-CM | POA: Diagnosis not present

## 2017-03-01 DIAGNOSIS — Z7951 Long term (current) use of inhaled steroids: Secondary | ICD-10-CM | POA: Diagnosis not present

## 2017-03-01 DIAGNOSIS — F319 Bipolar disorder, unspecified: Secondary | ICD-10-CM | POA: Diagnosis not present

## 2017-03-01 DIAGNOSIS — Z791 Long term (current) use of non-steroidal anti-inflammatories (NSAID): Secondary | ICD-10-CM | POA: Diagnosis not present

## 2017-03-01 DIAGNOSIS — I10 Essential (primary) hypertension: Secondary | ICD-10-CM | POA: Diagnosis not present

## 2017-03-01 DIAGNOSIS — G4730 Sleep apnea, unspecified: Secondary | ICD-10-CM | POA: Diagnosis not present

## 2017-03-01 DIAGNOSIS — H532 Diplopia: Secondary | ICD-10-CM | POA: Diagnosis not present

## 2017-03-01 DIAGNOSIS — G4700 Insomnia, unspecified: Secondary | ICD-10-CM | POA: Diagnosis not present

## 2017-03-01 DIAGNOSIS — H5000 Unspecified esotropia: Secondary | ICD-10-CM | POA: Diagnosis not present

## 2017-03-01 DIAGNOSIS — J45909 Unspecified asthma, uncomplicated: Secondary | ICD-10-CM | POA: Diagnosis not present

## 2017-03-01 DIAGNOSIS — H5005 Alternating esotropia: Secondary | ICD-10-CM | POA: Diagnosis not present

## 2017-03-01 DIAGNOSIS — Z79890 Hormone replacement therapy (postmenopausal): Secondary | ICD-10-CM | POA: Diagnosis not present

## 2017-03-01 DIAGNOSIS — Z8261 Family history of arthritis: Secondary | ICD-10-CM | POA: Diagnosis not present

## 2017-03-01 DIAGNOSIS — F418 Other specified anxiety disorders: Secondary | ICD-10-CM | POA: Diagnosis not present

## 2017-03-01 HISTORY — DX: Methicillin resistant Staphylococcus aureus infection, unspecified site: A49.02

## 2017-03-01 HISTORY — PX: STRABISMUS SURGERY: SHX218

## 2017-03-01 HISTORY — DX: Carrier or suspected carrier of methicillin resistant Staphylococcus aureus: Z22.322

## 2017-03-01 SURGERY — REPAIR STRABISMUS
Anesthesia: General | Site: Eye | Laterality: Left

## 2017-03-01 MED ORDER — LACTATED RINGERS IV SOLN
INTRAVENOUS | Status: DC
  Administered 2017-03-01: 09:00:00 via INTRAVENOUS

## 2017-03-01 MED ORDER — PROPOFOL 10 MG/ML IV BOLUS
INTRAVENOUS | Status: DC | PRN
  Administered 2017-03-01: 200 mg via INTRAVENOUS

## 2017-03-01 MED ORDER — GLYCOPYRROLATE 0.2 MG/ML IJ SOLN
INTRAMUSCULAR | Status: DC | PRN
  Administered 2017-03-01: .2 mg via INTRAVENOUS

## 2017-03-01 MED ORDER — LIDOCAINE 2% (20 MG/ML) 5 ML SYRINGE
INTRAMUSCULAR | Status: DC | PRN
  Administered 2017-03-01: 50 mg via INTRAVENOUS

## 2017-03-01 MED ORDER — OXYCODONE HCL 5 MG/5ML PO SOLN: 5.0000 mg | Freq: Once | ORAL | Status: AC | PRN

## 2017-03-01 MED ORDER — ONDANSETRON HCL 4 MG/2ML IJ SOLN
INTRAMUSCULAR | Status: DC | PRN
  Administered 2017-03-01: 4 mg via INTRAVENOUS

## 2017-03-01 MED ORDER — FENTANYL CITRATE (PF) 100 MCG/2ML IJ SOLN
50.0000 ug | INTRAMUSCULAR | Status: DC | PRN
  Administered 2017-03-01 (×2): 50 ug via INTRAVENOUS

## 2017-03-01 MED ORDER — TOBRAMYCIN-DEXAMETHASONE 0.3-0.1 % OP OINT
TOPICAL_OINTMENT | OPHTHALMIC | Status: DC | PRN
Start: 2017-03-01 — End: 2017-03-01
  Administered 2017-03-01: 1 via OPHTHALMIC

## 2017-03-01 MED ORDER — MIDAZOLAM HCL 2 MG/2ML IJ SOLN
1.0000 mg | INTRAMUSCULAR | Status: DC | PRN
  Administered 2017-03-01: 2 mg via INTRAVENOUS

## 2017-03-01 MED ORDER — OXYCODONE HCL 5 MG PO TABS
ORAL_TABLET | ORAL | Status: AC
  Filled 2017-03-01: qty 1

## 2017-03-01 MED ORDER — LIDOCAINE 2% (20 MG/ML) 5 ML SYRINGE
INTRAMUSCULAR | Status: AC
  Filled 2017-03-01: qty 5

## 2017-03-01 MED ORDER — HYDROMORPHONE HCL 1 MG/ML IJ SOLN
0.2500 mg | INTRAMUSCULAR | Status: DC | PRN
  Administered 2017-03-01 (×2): 0.5 mg via INTRAVENOUS

## 2017-03-01 MED ORDER — OXYCODONE HCL 5 MG PO TABS
5.0000 mg | ORAL_TABLET | Freq: Once | ORAL | Status: AC | PRN
  Administered 2017-03-01: 5 mg via ORAL

## 2017-03-01 MED ORDER — SCOPOLAMINE 1 MG/3DAYS TD PT72: 1.0000 | MEDICATED_PATCH | Freq: Once | TRANSDERMAL | Status: DC | PRN

## 2017-03-01 MED ORDER — PROMETHAZINE HCL 25 MG/ML IJ SOLN: 6.2500 mg | INTRAMUSCULAR | Status: DC | PRN

## 2017-03-01 MED ORDER — ONDANSETRON HCL 4 MG/2ML IJ SOLN
INTRAMUSCULAR | Status: AC
Start: 2017-03-01 — End: 2017-03-01
  Filled 2017-03-01: qty 2

## 2017-03-01 MED ORDER — MIDAZOLAM HCL 2 MG/2ML IJ SOLN
INTRAMUSCULAR | Status: AC
  Filled 2017-03-01: qty 2

## 2017-03-01 MED ORDER — HYDROMORPHONE HCL 1 MG/ML IJ SOLN
INTRAMUSCULAR | Status: AC
  Filled 2017-03-01: qty 0.5

## 2017-03-01 MED ORDER — FENTANYL CITRATE (PF) 100 MCG/2ML IJ SOLN
INTRAMUSCULAR | Status: AC
  Filled 2017-03-01: qty 2

## 2017-03-01 MED ORDER — ATROPINE SULFATE 0.4 MG/ML IJ SOLN
INTRAMUSCULAR | Status: AC
  Filled 2017-03-01: qty 1

## 2017-03-01 MED ORDER — DEXAMETHASONE SODIUM PHOSPHATE 4 MG/ML IJ SOLN
INTRAMUSCULAR | Status: DC | PRN
  Administered 2017-03-01: 10 mg via INTRAVENOUS

## 2017-03-01 MED ORDER — DEXAMETHASONE SODIUM PHOSPHATE 10 MG/ML IJ SOLN
INTRAMUSCULAR | Status: AC
  Filled 2017-03-01: qty 1

## 2017-03-01 MED ORDER — MEPERIDINE HCL 25 MG/ML IJ SOLN: 6.2500 mg | INTRAMUSCULAR | Status: DC | PRN

## 2017-03-01 SURGICAL SUPPLY — 30 items
APL SRG 3 HI ABS STRL LF PLS (MISCELLANEOUS) ×1
APPLICATOR COTTON TIP 6IN STRL (MISCELLANEOUS) ×8 IMPLANT
APPLICATOR DR MATTHEWS STRL (MISCELLANEOUS) ×2 IMPLANT
BANDAGE EYE OVAL (MISCELLANEOUS) IMPLANT
CAUTERY EYE LOW TEMP 1300F FIN (OPHTHALMIC RELATED) IMPLANT
COVER BACK TABLE 60X90IN (DRAPES) ×2 IMPLANT
COVER MAYO STAND STRL (DRAPES) ×2 IMPLANT
DRAPE SURG 17X23 STRL (DRAPES) ×4 IMPLANT
DRAPE U-SHAPE 76X120 STRL (DRAPES) ×1 IMPLANT
GLOVE BIO SURGEON STRL SZ 6.5 (GLOVE) ×4 IMPLANT
GLOVE BIOGEL M STRL SZ7.5 (GLOVE) ×2 IMPLANT
GOWN STRL REUS W/ TWL LRG LVL3 (GOWN DISPOSABLE) ×1 IMPLANT
GOWN STRL REUS W/TWL LRG LVL3 (GOWN DISPOSABLE) ×2
GOWN STRL REUS W/TWL XL LVL3 (GOWN DISPOSABLE) ×3 IMPLANT
NS IRRIG 1000ML POUR BTL (IV SOLUTION) ×2 IMPLANT
PACK BASIN DAY SURGERY FS (CUSTOM PROCEDURE TRAY) ×2 IMPLANT
SHEET MEDIUM DRAPE 40X70 STRL (DRAPES) IMPLANT
SPEAR EYE SURG WECK-CEL (MISCELLANEOUS) ×4 IMPLANT
STRIP CLOSURE SKIN 1/4X4 (GAUZE/BANDAGES/DRESSINGS) IMPLANT
SUT 6 0 SILK T G140 8DA (SUTURE) IMPLANT
SUT MERSILENE 6-0 18IN S14 8MM (SUTURE)
SUT PLAIN 6 0 TG1408 (SUTURE) ×1 IMPLANT
SUT SILK 4 0 C 3 735G (SUTURE) IMPLANT
SUT VICRYL 6 0 S 28 (SUTURE) IMPLANT
SUT VICRYL ABS 6-0 S29 18IN (SUTURE) ×1 IMPLANT
SUTURE MERSLN 6-0 18IN S14 8MM (SUTURE) IMPLANT
SYR 10ML LL (SYRINGE) ×2 IMPLANT
SYR TB 1ML LL NO SAFETY (SYRINGE) ×2 IMPLANT
TOWEL OR 17X24 6PK STRL BLUE (TOWEL DISPOSABLE) ×2 IMPLANT
TRAY DSU PREP LF (CUSTOM PROCEDURE TRAY) ×2 IMPLANT

## 2017-03-01 NOTE — Discharge Instructions (Signed)
°  Post Anesthesia Home Care Instructions  Activity: Get plenty of rest for the remainder of the day. A responsible individual must stay with you for 24 hours following the procedure.  For the next 24 hours, DO NOT: -Drive a car -Paediatric nurse -Drink alcoholic beverages -Take any medication unless instructed by your physician -Make any legal decisions or sign important papers.  Oxycodone 5mg  given at 1217 for pain.  Meals: Start with liquid foods such as gelatin or soup. Progress to regular foods as tolerated. Avoid greasy, spicy, heavy foods. If nausea and/or vomiting occur, drink only clear liquids until the nausea and/or vomiting subsides. Call your physician if vomiting continues.  Special Instructions/Symptoms: Your throat may feel dry or sore from the anesthesia or the breathing tube placed in your throat during surgery. If this causes discomfort, gargle with warm salt water. The discomfort should disappear within 24 hours.  If you had a scopolamine patch placed behind your ear for the management of post- operative nausea and/or vomiting:  1. The medication in the patch is effective for 72 hours, after which it should be removed.  Wrap patch in a tissue and discard in the trash. Wash hands thoroughly with soap and water. 2. You may remove the patch earlier than 72 hours if you experience unpleasant side effects which may include dry mouth, dizziness or visual disturbances. 3. Avoid touching the patch. Wash your hands with soap and water after contact with the patch.   Diet: Clear liquids, advance to soft foods then regular diet as tolerated by this evening.  Pain control:   1)  Ibuprofen 600 mg by mouth every 6-8 hours as needed for pain  2)  Ice pack/cold compress to operated eye(s) as desired  Eye medications:  Tobradex  eye ointment 1/2 inch in operated eye(s) twice a day  Activity: No swimming for 1 week.  It is OK to let water run over the face and eyes while showering  or taking a bath, even during the first week.  No other restriction on exercise or activity.    Call Dr. Janee Morn office [phone] with any problems or concerns.

## 2017-03-01 NOTE — Op Note (Signed)
03/01/2017  11:23 AM  PATIENT:  Deborah Maldonado  51 y.o. female  PRE-OPERATIVE DIAGNOSIS:  Esotropia, consecutive  POST-OPERATIVE DIAGNOSIS: same  PROCEDURE:  Medial rectus muscle recession  6.0 mm left eye (previously resected)  SURGEON:  Lorne Skeens.Annamaria Boots, M.D.   ANESTHESIA:   general  COMPLICATIONS:None  DESCRIPTION OF PROCEDURE: The patient was taken to the operating room where She was identified by me. General anesthesia was induced without difficulty after placement of appropriate monitors. The patient was prepped and draped in standard sterile fashion. A lid speculum was placed in the left eye.  Through an inferonasal fornix incision through conjunctiva and Tenon's fascia, the previously resected left medial rectus muscle was engaged on a series of muscle hooks and cleared of its fascial attachments and scar tissue. The tendon was secured with a double-armed 6-0 Vicryl suture with a double locking bite at each border of the muscle, 1 mm from the insertion. The muscle was disinserted, and was reattached to sclera at a measured distance of 6.0 millimeters posterior to the original insertion, using direct scleral passes in crossed swords fashion.  The suture ends were tied securely after the position of the muscle had been checked and found to be accurate. Conjunctiva was closed with 1 6-0 plain gut suture.  TobraDex ointment was placed in the left eye(s). The patient was awakened without difficulty and taken to the recovery room in stable condition, having suffered no intraoperative or immediate postoperative complications.  Lorne Skeens. Ruby Logiudice M.D.    PATIENT DISPOSITION:  PACU - hemodynamically stable.

## 2017-03-01 NOTE — Interval H&P Note (Signed)
History and Physical Interval Note:  03/01/2017 10:21 AM  Deborah Maldonado  has presented today for surgery, with the diagnosis of ESOTROPIA  The various methods of treatment have been discussed with the patient and family. After consideration of risks, benefits and other options for treatment, the patient has consented to  Procedure(s): REPAIR STRABISMUS LEFT EYE (Left) as a surgical intervention .  The patient's history has been reviewed, patient examined, no change in status, stable for surgery.  I have reviewed the patient's chart and labs.  Questions were answered to the patient's satisfaction.     Derry Skill

## 2017-03-01 NOTE — Anesthesia Procedure Notes (Signed)
Procedure Name: LMA Insertion Performed by: Deshunda Thackston W, CRNA Pre-anesthesia Checklist: Patient identified, Emergency Drugs available, Suction available and Patient being monitored Patient Re-evaluated:Patient Re-evaluated prior to induction Oxygen Delivery Method: Circle system utilized Preoxygenation: Pre-oxygenation with 100% oxygen Induction Type: IV induction Ventilation: Mask ventilation without difficulty LMA: LMA flexible inserted LMA Size: 4.0 Number of attempts: 1 Placement Confirmation: positive ETCO2 Tube secured with: Tape Dental Injury: Teeth and Oropharynx as per pre-operative assessment        

## 2017-03-01 NOTE — Anesthesia Postprocedure Evaluation (Signed)
Anesthesia Post Note  Patient: Deborah Maldonado  Procedure(s) Performed: REPAIR STRABISMUS LEFT EYE (Left Eye)     Patient location during evaluation: PACU Anesthesia Type: General Level of consciousness: awake and alert Pain management: pain level controlled Vital Signs Assessment: post-procedure vital signs reviewed and stable Respiratory status: spontaneous breathing, nonlabored ventilation and respiratory function stable Cardiovascular status: blood pressure returned to baseline and stable Postop Assessment: no apparent nausea or vomiting Anesthetic complications: no    Last Vitals:  Vitals:   03/01/17 1206 03/01/17 1229  BP: 120/90 139/90  Pulse: 94 94  Resp: (!) 21 18  Temp:  36.7 C  SpO2: 92% 95%    Last Pain:  Vitals:   03/01/17 1229  TempSrc:   PainSc: [place]

## 2017-03-01 NOTE — Anesthesia Preprocedure Evaluation (Signed)
Anesthesia Evaluation  Patient identified by MRN, date of birth, ID band Patient awake    Reviewed: Allergy & Precautions, H&P , NPO status , Patient's Chart, lab work & pertinent test results  Airway Mallampati: II  TM Distance: >3 FB Neck ROM: Full    Dental no notable dental hx. (+) Teeth Intact, Dental Advisory Given   Pulmonary asthma , sleep apnea ,    Pulmonary exam normal breath sounds clear to auscultation       Cardiovascular hypertension, Pt. on medications  Rhythm:Regular Rate:Normal     Neuro/Psych  Headaches, Anxiety Depression Bipolar Disorder    GI/Hepatic Neg liver ROS, GERD  Medicated and Controlled,  Endo/Other  Hypothyroidism Morbid obesity  Renal/GU negative Renal ROS  negative genitourinary   Musculoskeletal  (+) Arthritis , Osteoarthritis,  Fibromyalgia -  Abdominal   Peds  Hematology negative hematology ROS (+)   Anesthesia Other Findings   Reproductive/Obstetrics negative OB ROS                             Anesthesia Physical  Anesthesia Plan  ASA: III  Anesthesia Plan: General   Post-op Pain Management:    Induction: Intravenous  PONV Risk Score and Plan: 4 or greater and Ondansetron, Dexamethasone, Midazolam, Scopolamine patch - Pre-op and Treatment may vary due to age or medical condition  Airway Management Planned: LMA  Additional Equipment:   Intra-op Plan:   Post-operative Plan: Extubation in OR  Informed Consent: I have reviewed the patients History and Physical, chart, labs and discussed the procedure including the risks, benefits and alternatives for the proposed anesthesia with the patient or authorized representative who has indicated his/her understanding and acceptance.   Dental advisory given  Plan Discussed with: CRNA  Anesthesia Plan Comments:         Anesthesia Quick Evaluation

## 2017-03-01 NOTE — Transfer of Care (Signed)
Immediate Anesthesia Transfer of Care Note  Patient: Deborah Maldonado  Procedure(s) Performed: REPAIR STRABISMUS LEFT EYE (Left Eye)  Patient Location: PACU  Anesthesia Type:General  Level of Consciousness: awake, alert  and oriented  Airway & Oxygen Therapy: Patient Spontanous Breathing and Patient connected to face mask oxygen  Post-op Assessment: Report given to RN and Post -op Vital signs reviewed and stable  Post vital signs: Reviewed and stable  Last Vitals:  Vitals:   03/01/17 0858 03/01/17 1119  BP: 125/78   Pulse: 88 (!) 106  Resp: 18   Temp: 36.8 C 36.6 C  SpO2: 98% 94%    Last Pain:  Vitals:   03/01/17 0858  TempSrc: Oral      Patients Stated Pain Goal: 0 (45/99/77 [id number])  Complications: No apparent anesthesia complications

## 2017-03-03 ENCOUNTER — Encounter (HOSPITAL_BASED_OUTPATIENT_CLINIC_OR_DEPARTMENT_OTHER): Payer: Self-pay | Admitting: Ophthalmology

## 2017-03-11 DIAGNOSIS — G4733 Obstructive sleep apnea (adult) (pediatric): Secondary | ICD-10-CM | POA: Diagnosis not present

## 2017-03-12 DIAGNOSIS — M79661 Pain in right lower leg: Secondary | ICD-10-CM | POA: Diagnosis not present

## 2017-03-12 DIAGNOSIS — R200 Anesthesia of skin: Secondary | ICD-10-CM | POA: Diagnosis not present

## 2017-03-12 DIAGNOSIS — J3489 Other specified disorders of nose and nasal sinuses: Secondary | ICD-10-CM | POA: Diagnosis not present

## 2017-03-12 DIAGNOSIS — L0211 Cutaneous abscess of neck: Secondary | ICD-10-CM | POA: Diagnosis not present

## 2017-03-13 DIAGNOSIS — J4520 Mild intermittent asthma, uncomplicated: Secondary | ICD-10-CM | POA: Diagnosis not present

## 2017-03-13 DIAGNOSIS — G4733 Obstructive sleep apnea (adult) (pediatric): Secondary | ICD-10-CM | POA: Diagnosis not present

## 2017-03-13 DIAGNOSIS — J301 Allergic rhinitis due to pollen: Secondary | ICD-10-CM | POA: Diagnosis not present

## 2017-03-14 DIAGNOSIS — M79661 Pain in right lower leg: Secondary | ICD-10-CM | POA: Diagnosis not present

## 2017-03-14 DIAGNOSIS — F3174 Bipolar disorder, in full remission, most recent episode manic: Secondary | ICD-10-CM | POA: Diagnosis not present

## 2017-03-19 DIAGNOSIS — M25561 Pain in right knee: Secondary | ICD-10-CM | POA: Diagnosis not present

## 2017-03-20 DIAGNOSIS — F3174 Bipolar disorder, in full remission, most recent episode manic: Secondary | ICD-10-CM | POA: Diagnosis not present

## 2017-04-08 DIAGNOSIS — F3130 Bipolar disorder, current episode depressed, mild or moderate severity, unspecified: Secondary | ICD-10-CM | POA: Diagnosis not present

## 2017-04-09 DIAGNOSIS — K5909 Other constipation: Secondary | ICD-10-CM | POA: Diagnosis not present

## 2017-04-09 DIAGNOSIS — G4700 Insomnia, unspecified: Secondary | ICD-10-CM | POA: Diagnosis not present

## 2017-04-09 DIAGNOSIS — Z1231 Encounter for screening mammogram for malignant neoplasm of breast: Secondary | ICD-10-CM | POA: Diagnosis not present

## 2017-04-09 DIAGNOSIS — Z6839 Body mass index (BMI) 39.0-39.9, adult: Secondary | ICD-10-CM | POA: Diagnosis not present

## 2017-04-23 DIAGNOSIS — F3174 Bipolar disorder, in full remission, most recent episode manic: Secondary | ICD-10-CM | POA: Diagnosis not present

## 2017-04-25 DIAGNOSIS — J329 Chronic sinusitis, unspecified: Secondary | ICD-10-CM | POA: Diagnosis not present

## 2017-04-25 DIAGNOSIS — J342 Deviated nasal septum: Secondary | ICD-10-CM | POA: Diagnosis not present

## 2017-04-25 DIAGNOSIS — J3489 Other specified disorders of nose and nasal sinuses: Secondary | ICD-10-CM | POA: Diagnosis not present

## 2017-05-09 DIAGNOSIS — F3132 Bipolar disorder, current episode depressed, moderate: Secondary | ICD-10-CM | POA: Diagnosis not present

## 2017-05-15 DIAGNOSIS — F3131 Bipolar disorder, current episode depressed, mild: Secondary | ICD-10-CM | POA: Diagnosis not present

## 2017-05-20 DIAGNOSIS — M542 Cervicalgia: Secondary | ICD-10-CM | POA: Diagnosis not present

## 2017-05-20 DIAGNOSIS — S2001XA Contusion of right breast, initial encounter: Secondary | ICD-10-CM | POA: Diagnosis not present

## 2017-05-20 DIAGNOSIS — M4322 Fusion of spine, cervical region: Secondary | ICD-10-CM | POA: Diagnosis not present

## 2017-05-20 DIAGNOSIS — E039 Hypothyroidism, unspecified: Secondary | ICD-10-CM | POA: Diagnosis not present

## 2017-05-20 DIAGNOSIS — S2002XA Contusion of left breast, initial encounter: Secondary | ICD-10-CM | POA: Diagnosis not present

## 2017-05-20 DIAGNOSIS — D509 Iron deficiency anemia, unspecified: Secondary | ICD-10-CM | POA: Diagnosis not present

## 2017-05-20 DIAGNOSIS — E785 Hyperlipidemia, unspecified: Secondary | ICD-10-CM | POA: Diagnosis not present

## 2017-06-04 DIAGNOSIS — F3131 Bipolar disorder, current episode depressed, mild: Secondary | ICD-10-CM | POA: Diagnosis not present

## 2017-06-10 DIAGNOSIS — N390 Urinary tract infection, site not specified: Secondary | ICD-10-CM | POA: Diagnosis not present

## 2017-06-11 DIAGNOSIS — G4733 Obstructive sleep apnea (adult) (pediatric): Secondary | ICD-10-CM | POA: Diagnosis not present

## 2017-06-11 DIAGNOSIS — J301 Allergic rhinitis due to pollen: Secondary | ICD-10-CM | POA: Diagnosis not present

## 2017-06-11 DIAGNOSIS — J4520 Mild intermittent asthma, uncomplicated: Secondary | ICD-10-CM | POA: Diagnosis not present

## 2017-07-03 DIAGNOSIS — F3131 Bipolar disorder, current episode depressed, mild: Secondary | ICD-10-CM | POA: Diagnosis not present

## 2017-08-26 DIAGNOSIS — K120 Recurrent oral aphthae: Secondary | ICD-10-CM | POA: Diagnosis not present

## 2017-08-26 DIAGNOSIS — B001 Herpesviral vesicular dermatitis: Secondary | ICD-10-CM | POA: Diagnosis not present

## 2017-08-28 DIAGNOSIS — F3174 Bipolar disorder, in full remission, most recent episode manic: Secondary | ICD-10-CM | POA: Diagnosis not present

## 2017-09-08 IMAGING — CT CT PELVIS W/O CM
2 series · 13 of 32 positions shown, 19 images · non-contrast
Comparison: Comparison made with prior radiograph from 04/01/2015
as well as prior CT from 04/20/2004.

CLINICAL DATA: Initial evaluation for right hip pain with right
buttock hematoma. History of right hip replacement.

EXAM:
CT PELVIS WITHOUT CONTRAST
TECHNIQUE: Multidetector CT imaging of the pelvis was performed following the
standard protocol without intravenous contrast.

[Series 3: soft tissue pelvis without · axial · non-contrast · 0.98mm/px · z∈[+446,+720]mm · 10 of 69 slices shown, 16 images]
[im 7/69  soft-tissue]
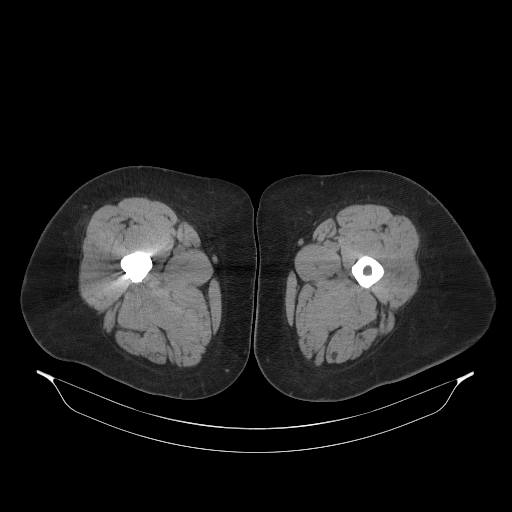
[im 7/69  bone]
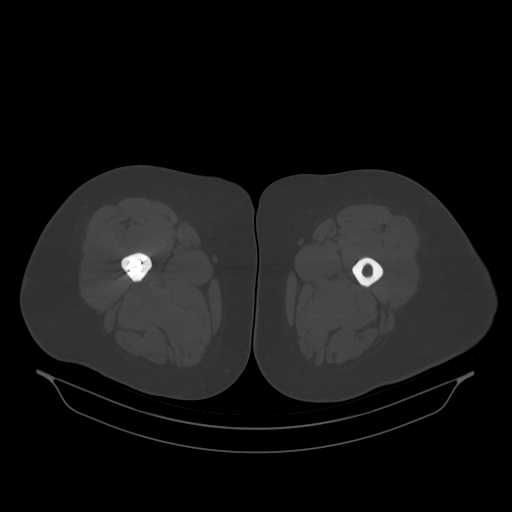
[im 13/69  soft-tissue]
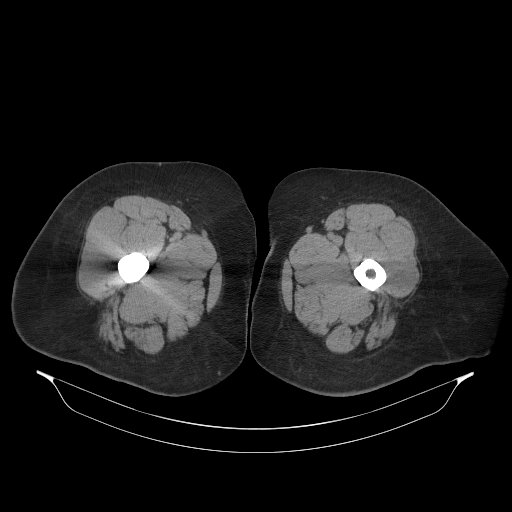
[im 19/69  soft-tissue]
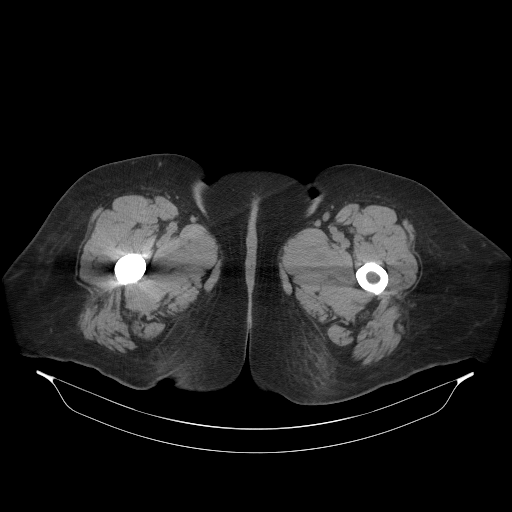
[im 25/69  soft-tissue]
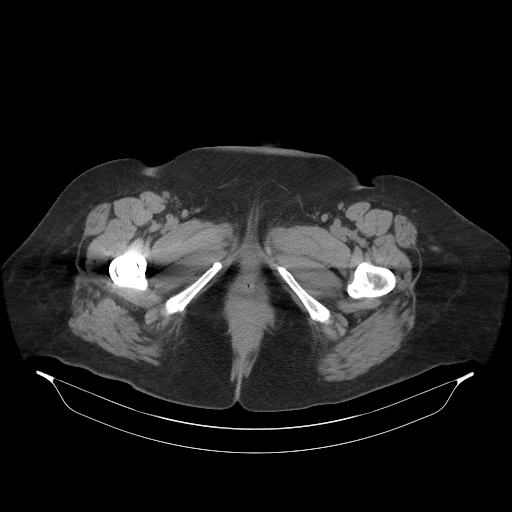
[im 31/69  soft-tissue]
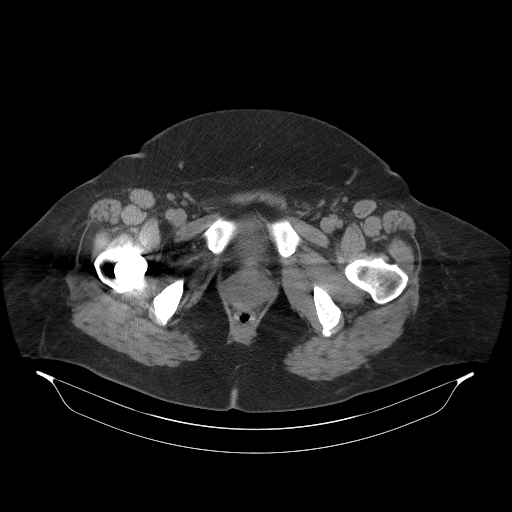
[im 38/69  soft-tissue]
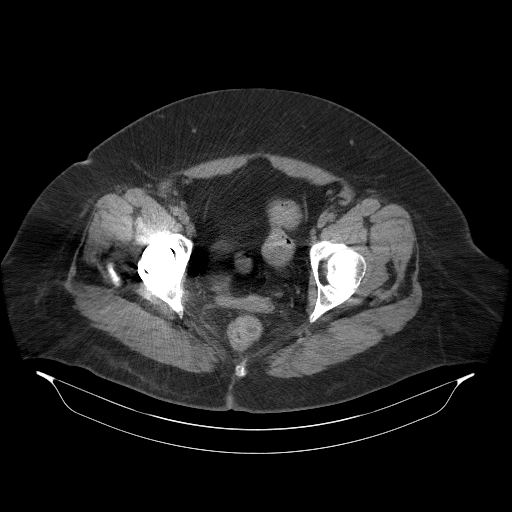
[im 44/69  soft-tissue]
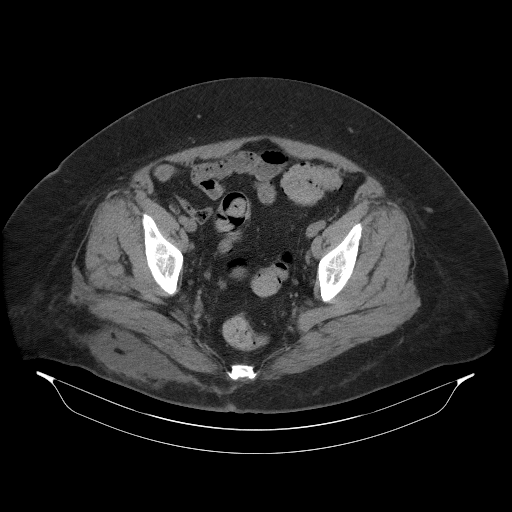
[im 44/69  lung]
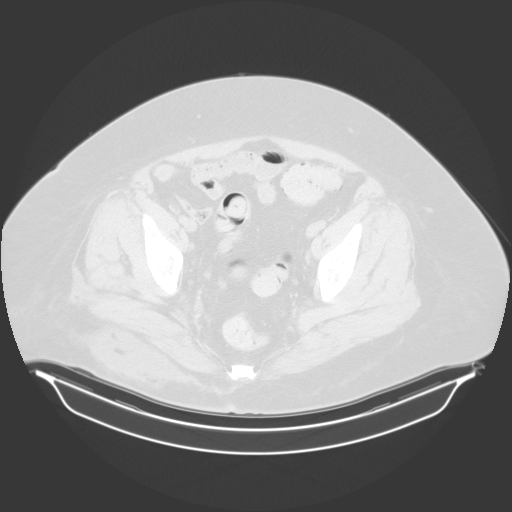
[im 50/69  soft-tissue]
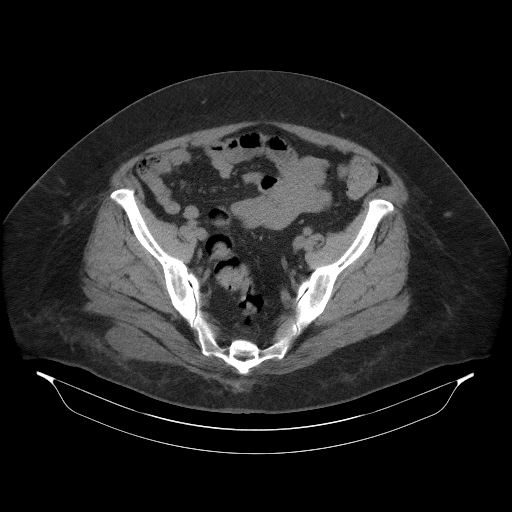
[im 50/69  lung]
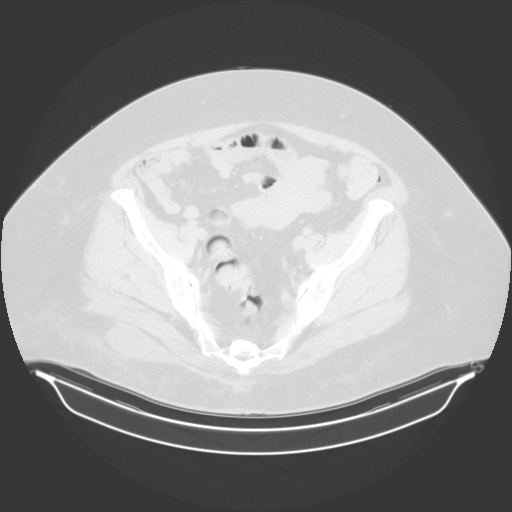
[im 56/69  soft-tissue]
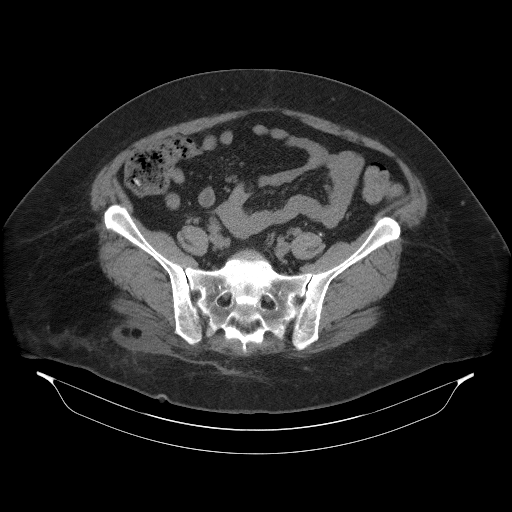
[im 56/69  lung]
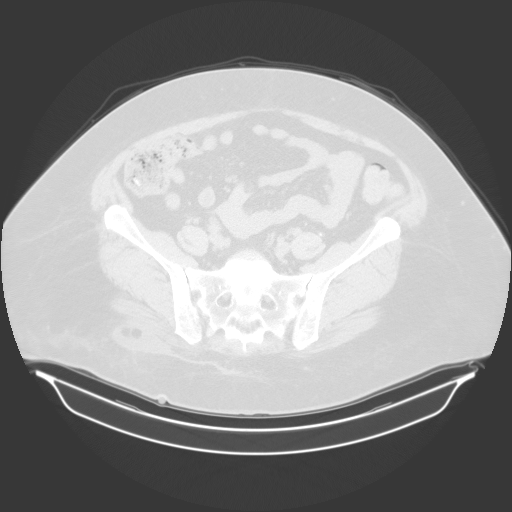
[im 56/69  bone]
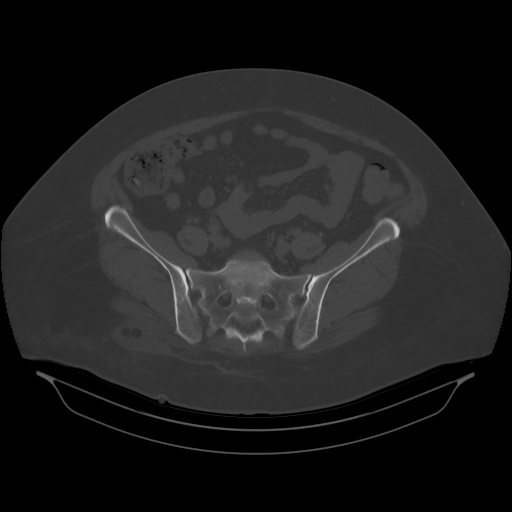
[im 62/69  soft-tissue]
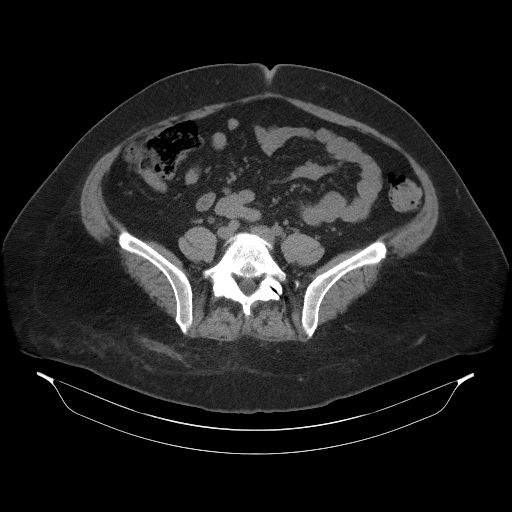
[im 62/69  lung]
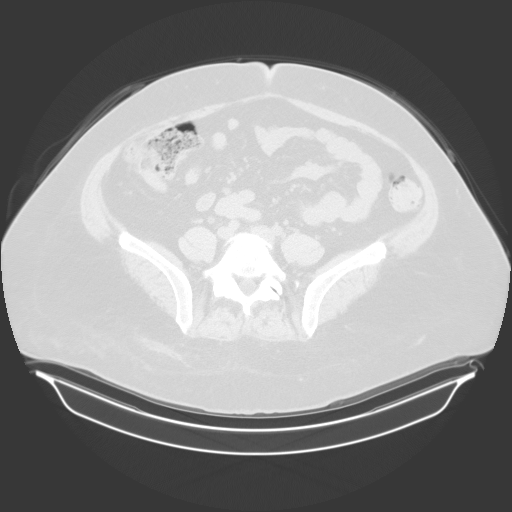

[Series 4: bone (person_name) · axial · 0.98mm/px · z∈[+450,+522]mm · 3 of 115 slices shown]
[im 13/115  bone]
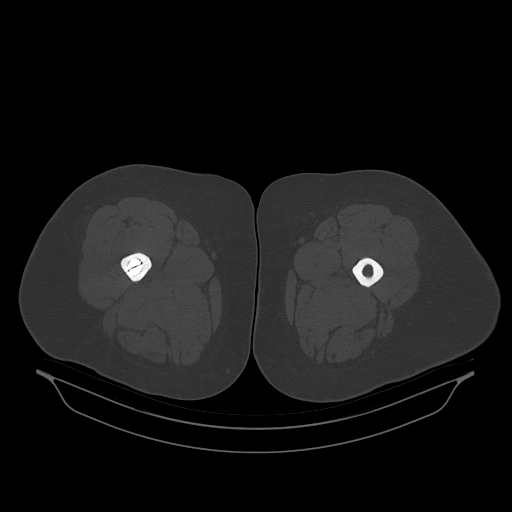
[im 25/115  bone]
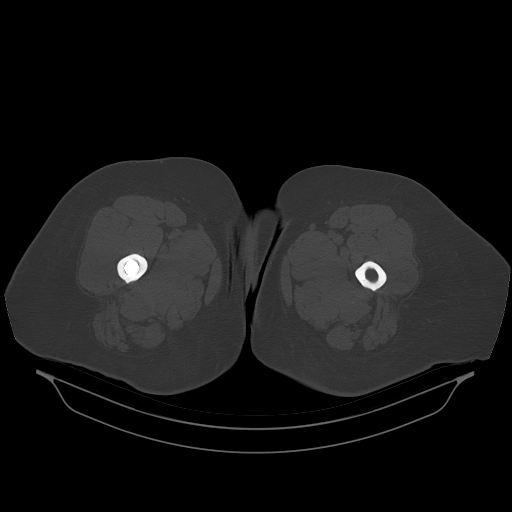
[im 37/115  bone]
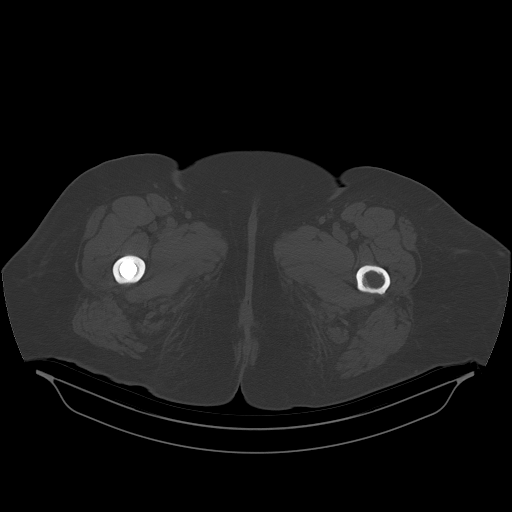

[13 of 32 positions shown; findings below may reference images not displayed]

FINDINGS: Urinary Tract: Visualized distal ureter is within normal limits.
Bladder within normal limits.

Bowel: Visualized bowel unremarkable without evidence for
obstruction or acute inflammation. Patient appears to be status post
appendectomy.

Vascular/Lymphatic: No vascular abnormality identified on the S
noncontrast examination. No adenopathy.

Reproductive:  Uterus is absent.  Ovaries not discretely identified.

Other:  No free air or fluid.  No intrapelvic hematoma.

Musculoskeletal: Focal hematoma within the subcutaneous fat of the
right gluteal region present, measuring 4.2 x 10.9 x 12.0 cm (series
3, image 24). Hematoma is largely confined to the subcutaneous fat
without intramuscular extension to the subjacent gluteal
musculature. Mild associated soft tissue stranding. No other focal
soft tissue hematoma.

Right total hip arthroplasty in place. Visualized acetabular and
femoral components appear well seated, and articulate normally with
1 another. No acute periprosthetic fracture. No other acute osseous
abnormality within the pelvis. SI joints are approximated and
symmetric. Degenerative spondylolysis with disc desiccation noted at
L4-5.
IMPRESSION: 1. 4.2 x 10.9 x 12.0 cm subacute hematoma within the subcutaneous
fat of the right gluteal region. Hematoma is largely confined to the
subcutaneous fat, with no deeper extension into the subjacent right
gluteal musculature.
2. Right total hip arthroplasty in place without complication. No
acute osseous abnormality within the pelvis.
3. Degenerative spondylolysis at L4-5.

## 2017-09-09 DIAGNOSIS — M19011 Primary osteoarthritis, right shoulder: Secondary | ICD-10-CM | POA: Diagnosis not present

## 2017-09-09 DIAGNOSIS — R600 Localized edema: Secondary | ICD-10-CM | POA: Diagnosis not present

## 2017-09-09 DIAGNOSIS — K121 Other forms of stomatitis: Secondary | ICD-10-CM | POA: Diagnosis not present

## 2017-09-09 DIAGNOSIS — N6320 Unspecified lump in the left breast, unspecified quadrant: Secondary | ICD-10-CM | POA: Diagnosis not present

## 2017-10-17 DIAGNOSIS — N6320 Unspecified lump in the left breast, unspecified quadrant: Secondary | ICD-10-CM | POA: Diagnosis not present

## 2017-10-17 DIAGNOSIS — R928 Other abnormal and inconclusive findings on diagnostic imaging of breast: Secondary | ICD-10-CM | POA: Diagnosis not present

## 2017-10-17 DIAGNOSIS — N6322 Unspecified lump in the left breast, upper inner quadrant: Secondary | ICD-10-CM | POA: Diagnosis not present

## 2017-10-23 DIAGNOSIS — F3174 Bipolar disorder, in full remission, most recent episode manic: Secondary | ICD-10-CM | POA: Diagnosis not present

## 2017-10-29 DIAGNOSIS — H04202 Unspecified epiphora, left lacrimal gland: Secondary | ICD-10-CM | POA: Diagnosis not present

## 2017-10-30 DIAGNOSIS — N952 Postmenopausal atrophic vaginitis: Secondary | ICD-10-CM | POA: Diagnosis not present

## 2017-11-12 ENCOUNTER — Emergency Department (HOSPITAL_COMMUNITY): Payer: BLUE CROSS/BLUE SHIELD

## 2017-11-12 ENCOUNTER — Other Ambulatory Visit: Payer: Self-pay

## 2017-11-12 ENCOUNTER — Emergency Department (HOSPITAL_COMMUNITY)
Admission: EM | Admit: 2017-11-12 | Discharge: 2017-11-12 | Disposition: A | Discharge status: Home / Self Care | Payer: BLUE CROSS/BLUE SHIELD | Attending: Emergency Medicine | Admitting: Emergency Medicine

## 2017-11-12 ENCOUNTER — Encounter (HOSPITAL_COMMUNITY): Payer: Self-pay

## 2017-11-12 DIAGNOSIS — Y9389 Activity, other specified: Secondary | ICD-10-CM | POA: Insufficient documentation

## 2017-11-12 DIAGNOSIS — Z471 Aftercare following joint replacement surgery: Secondary | ICD-10-CM | POA: Diagnosis not present

## 2017-11-12 DIAGNOSIS — I10 Essential (primary) hypertension: Secondary | ICD-10-CM | POA: Insufficient documentation

## 2017-11-12 DIAGNOSIS — Z79899 Other long term (current) drug therapy: Secondary | ICD-10-CM | POA: Insufficient documentation

## 2017-11-12 DIAGNOSIS — R52 Pain, unspecified: Secondary | ICD-10-CM

## 2017-11-12 DIAGNOSIS — Z96641 Presence of right artificial hip joint: Secondary | ICD-10-CM | POA: Insufficient documentation

## 2017-11-12 DIAGNOSIS — S73004A Unspecified dislocation of right hip, initial encounter: Secondary | ICD-10-CM | POA: Insufficient documentation

## 2017-11-12 DIAGNOSIS — E039 Hypothyroidism, unspecified: Secondary | ICD-10-CM | POA: Diagnosis not present

## 2017-11-12 DIAGNOSIS — R231 Pallor: Secondary | ICD-10-CM | POA: Diagnosis not present

## 2017-11-12 DIAGNOSIS — Y929 Unspecified place or not applicable: Secondary | ICD-10-CM | POA: Insufficient documentation

## 2017-11-12 DIAGNOSIS — J45909 Unspecified asthma, uncomplicated: Secondary | ICD-10-CM | POA: Insufficient documentation

## 2017-11-12 DIAGNOSIS — Y999 Unspecified external cause status: Secondary | ICD-10-CM | POA: Insufficient documentation

## 2017-11-12 DIAGNOSIS — X500XXA Overexertion from strenuous movement or load, initial encounter: Secondary | ICD-10-CM | POA: Diagnosis not present

## 2017-11-12 DIAGNOSIS — S79911A Unspecified injury of right hip, initial encounter: Secondary | ICD-10-CM | POA: Diagnosis not present

## 2017-11-12 DIAGNOSIS — T84020A Dislocation of internal right hip prosthesis, initial encounter: Secondary | ICD-10-CM | POA: Diagnosis not present

## 2017-11-12 MED ORDER — FENTANYL CITRATE (PF) 100 MCG/2ML IJ SOLN
100.0000 ug | Freq: Once | INTRAMUSCULAR | Status: AC
  Administered 2017-11-12: 100 ug via INTRAVENOUS
  Filled 2017-11-12: qty 2

## 2017-11-12 MED ORDER — HYDROCODONE-ACETAMINOPHEN 5-325 MG PO TABS: 1.0000 | ORAL_TABLET | ORAL | 0 refills | Status: AC | PRN

## 2017-11-12 MED ORDER — LACTATED RINGERS IV BOLUS
1000.0000 mL | Freq: Once | INTRAVENOUS | Status: AC
  Administered 2017-11-12: 1000 mL via INTRAVENOUS

## 2017-11-12 MED ORDER — PROPOFOL 10 MG/ML IV BOLUS
1.0000 mg/kg | Freq: Once | INTRAVENOUS | Status: AC
  Administered 2017-11-12: 109.8 mg via INTRAVENOUS
  Filled 2017-11-12: qty 20

## 2017-11-12 MED ORDER — PROPOFOL 10 MG/ML IV BOLUS
INTRAVENOUS | Status: AC | PRN
  Administered 2017-11-12: 80 mg via INTRAVENOUS
  Administered 2017-11-12: 20 mg via INTRAVENOUS
  Administered 2017-11-12: 40 mg via INTRAVENOUS

## 2017-11-12 MED ORDER — ONDANSETRON HCL 4 MG/2ML IJ SOLN
4.0000 mg | Freq: Once | INTRAMUSCULAR | Status: AC
  Administered 2017-11-12: 4 mg via INTRAVENOUS
  Filled 2017-11-12: qty 2

## 2017-11-12 NOTE — ED Triage Notes (Signed)
EMS reports from home, right hip replacement 2016, Pt stood up and bent over to pick up paper on floor and felt right hip pop, visible rotation. Pt has had two other prior dislocations. Ortho DR. Jud[facility].  20ga RAC  BP 138/78 HR 68 RR 16 Sp02 96 RA  335mcg Fentanyl 400 NS enroute

## 2017-11-12 NOTE — ED Notes (Signed)
Bed: YL16 Expected date:  Expected time:  Means of arrival:  Comments: EMS-hip dislocation

## 2017-11-12 NOTE — ED Provider Notes (Addendum)
[facility] DEPT Provider Note   CSN: [id number] Arrival date & time: 11/12/17  1323     History   Chief Complaint Chief Complaint  Patient presents with  . Hip Injury    HPI Deborah Maldonado is a 52 y.o. female.  Patient with history of right hip replacement and bent over to pick up something off the floor and felt right hip pop. Believes that right hip is out. Patient got improved with immbolization and fentanyl. No pain elsewhere  The history is provided by the patient.  Hip Pain  This is a new problem. The current episode started 1 to 2 hours ago. The problem occurs constantly. The problem has not changed since onset.Pertinent negatives include no chest pain, no abdominal pain, no headaches and no shortness of breath. Exacerbated by: movement. Relieved by: fentanyl. She has tried nothing for the symptoms. The treatment provided no relief.    Past Medical History:  Diagnosis Date  . Anxiety   . Arthritis   . Asthma   . Back pain    facet joint encapsulated   . Bipolar disorder ([place])    takes Lithium daily  . Depression    takes VRAYLAR, SEROQUEL, LAMICTAL  . Fibromyalgia    takes Lyrica daily  . GERD (gastroesophageal reflux disease)    takes Dexilant daily  . Headache    occasionally  . History of bronchitis early 2016  . Hypertension    takes Hyzaar daily  . Hypothyroidism    takes Synthroid daily  . Insomnia    takes Triazolam nightly  . Joint pain   . MRSA (methicillin resistant staph aureus) culture positive    tested positive in may after sinus infection  . MRSA (methicillin resistant Staphylococcus aureus) 08/2016   nose, per patient  . Pneumonia 2014  . Seasonal allergies    uses Dymista daily and takes Allegra daily  . Sleep apnea    has not used CPAP since sinus surgery 55mos ago  . Slow urinary stream    bladder stem stretched this year    Patient Active Problem List   Diagnosis Date Noted  . Lipoma 10/03/2016   . BP (high blood pressure) 07/12/2015  . Hip dislocation, right ([place]) 07/11/2015  . Primary osteoarthritis of right hip 01/19/2015  . Venous insufficiency of leg 07/31/2012  . Breathlessness on exertion 07/14/2012  . Leg pain 07/14/2012  . Leg swelling 07/14/2012    Past Surgical History:  Procedure Laterality Date  . ABDOMINAL HYSTERECTOMY    . APPENDECTOMY    . CHOLECYSTECTOMY    . EYE SURGERY  06/2016   at [facility]  . HIP CLOSED REDUCTION Right 07/11/2015   Procedure: CLOSED REDUCTION HIP;  Surgeon: Melrose Nakayama, MD;  Location: [facility];  Service: Orthopedics;  Laterality: Right;  . JOINT REPLACEMENT    . MASS EXCISION Right 10/03/2016   Procedure: EXCISION OF RIGHT GLUTEAL LIPOMA WITH PATH EVALUATION;  Surgeon: Wallace Going, DO;  Location: [facility];  Service: Plastics;  Laterality: Right;  . NASAL SINUS SURGERY  04/2016   turbinate reduction  . NECK SURGERY     FUSION OF C4-5  . STRABISMUS SURGERY Left 03/01/2017   Procedure: REPAIR STRABISMUS LEFT EYE;  Surgeon: Everitt Amber, MD;  Location: [facility];  Service: Ophthalmology;  Laterality: Left;  . TOTAL HIP ARTHROPLASTY Right 01/19/2015   Procedure: RIGHT TOTAL HIP ARTHROPLASTY;  Surgeon: Frederik Pear, MD;  Location: [facility];  Service: Orthopedics;  Laterality: Right;  . TUBAL LIGATION       OB History   None      Home Medications    Prior to Admission medications   Medication Sig Start Date End Date Taking? Authorizing Provider  atorvastatin (LIPITOR) 20 MG tablet Take 20 mg by mouth daily.   Yes [provider]  Azelastine-Fluticasone (DYMISTA) 137-50 MCG/ACT SUSP Place 1 spray into the nose 2 (two) times daily.   Yes [provider]  dexlansoprazole (DEXILANT) 60 MG capsule Take 60 mg by mouth daily.   Yes [provider]  diclofenac (VOLTAREN) 75 MG EC tablet Take 75 mg by mouth 2 (two) times daily.   Yes [provider]  estradiol (ESTRACE) 1  MG tablet Take 1 mg by mouth daily. 08/28/17  Yes [provider]  lamoTRIgine (LAMICTAL) 150 MG tablet Take 150 mg by mouth 2 (two) times daily. 10/24/17  Yes [provider]  levothyroxine (SYNTHROID, LEVOTHROID) 150 MCG tablet Take 150 mcg by mouth daily before breakfast.   Yes [provider]  losartan-hydrochlorothiazide (HYZAAR) 100-25 MG tablet Take 1 tablet by mouth daily.   Yes [provider]  potassium chloride SA (K-DUR,KLOR-CON) 20 MEQ tablet Take 40 mEq by mouth daily.    Yes [provider]  pregabalin (LYRICA) 225 MG capsule Take 225 mg by mouth 2 (two) times daily.   Yes [provider]  PREMARIN vaginal cream Place 1 g vaginally 3 (three) times a week. As needed for dryness 10/30/17  Yes [provider]  QUEtiapine (SEROQUEL) 50 MG tablet Take 50 mg by mouth 2 (two) times daily.    Yes [provider]  zolpidem (AMBIEN CR) 12.5 MG CR tablet Take 12.5 mg by mouth at bedtime as needed for sleep.    Yes [provider]  furosemide (LASIX) 40 MG tablet Take 40 mg by mouth as needed for fluid or edema.  06/29/15   [provider]  HYDROcodone-acetaminophen (NORCO/VICODIN) 5-325 MG tablet Take 1-2 tablets by mouth every 4 (four) hours as needed for up to 12 doses for moderate pain or severe pain (1 for moderate pain, 2 for severe pain). 11/12/17   Lennice Sites, DO    Family History Family History  Problem Relation Age of Onset  . Cancer Mother   . Hypertension Mother   . Cancer Father   . Hypertension Father   . Arthritis Father   . Heart failure Paternal Grandfather   . Heart attack Maternal Grandmother   . Cancer Maternal Grandfather   . Alzheimer's disease Paternal Grandmother     Social History Social History   Tobacco Use  . Smoking status: Never Smoker  . Smokeless tobacco: Never Used  Substance Use Topics  . Alcohol use: No    Comment: 1 drink a month  . Drug use: No      Allergies   Penicillins and Morphine and related   Review of Systems Review of Systems  Constitutional: Negative for chills and fever.  HENT: Negative for ear pain and sore throat.   Eyes: Negative for pain and visual disturbance.  Respiratory: Negative for cough and shortness of breath.   Cardiovascular: Negative for chest pain and palpitations.  Gastrointestinal: Negative for abdominal pain and vomiting.  Genitourinary: Negative for dysuria and hematuria.  Musculoskeletal: Negative for arthralgias and back pain.  Skin: Negative for color change and rash.  Neurological: Negative for seizures, syncope and headaches.  All other systems  reviewed and are negative.    Physical Exam Updated Vital Signs  ED Triage Vitals  Enc Vitals Group     BP 11/12/17 1332 130/85     Pulse Rate 11/12/17 1332 71     Resp 11/12/17 1332 16     Temp 11/12/17 1333 98.1 F (36.7 C)     Temp src --      SpO2 11/12/17 1332 99 %     Weight 11/12/17 1333 242 lb (109.8 kg)     Height 11/12/17 1333 5\' 7"  (1.702 m)     Head Circumference --      Peak Flow --      Pain Score 11/12/17 1333 10     Pain Loc --      Pain Edu? --      Excl. in [facility]? --     Physical Exam  Constitutional: She is oriented to person, place, and time. She appears well-developed and well-nourished. No distress.  HENT:  Head: Normocephalic and atraumatic.  Eyes: Pupils are equal, round, and reactive to light. Conjunctivae and EOM are normal.  Neck: Normal range of motion. Neck supple.  Cardiovascular: Normal rate, regular rhythm, normal heart sounds and intact distal pulses.  No murmur heard. Pulmonary/Chest: Effort normal and breath sounds normal. No respiratory distress.  Abdominal: Soft. There is no tenderness.  Musculoskeletal: She exhibits tenderness (TTP to right hip, shorten and rotated). She exhibits no edema.  Neurological: She is alert and oriented to person, place, and time.  Skin: Skin is warm and dry.  Capillary refill takes less than 2 seconds. No rash noted.  Psychiatric: She has a normal mood and affect.  Nursing note and vitals reviewed.    ED Treatments / Results  Labs (all labs ordered are listed, but only abnormal results are displayed) Labs Reviewed - No data to display  EKG None  Radiology Dg Pelvis 1-2 Views  Result Date: 11/12/2017 CLINICAL DATA:  Right hip popped EXAM: PELVIS - 1-2 VIEW COMPARISON:  02/07/2016 FINDINGS: Right total hip arthroplasty is anatomically aligned. No breakage or loosening of the hardware. No fracture. IMPRESSION: No acute bony pathology. Right total hip arthroplasty is anatomically aligned. Electronically Signed   By: Marybelle Killings M.D.   On: 11/12/2017 17:03   Dg Hip Port Unilat With Pelvis 1v Right  Result Date: 11/12/2017 CLINICAL DATA:  Right hip dislocation. EXAM: DG HIP (WITH OR WITHOUT PELVIS) 1V PORT RIGHT COMPARISON:  Radiographs of Jul 11, 2015. FINDINGS: Superior dislocation of right femoral component is noted relative to the right acetabulum. No fracture is noted. Visualized portion of left hip appears normal. IMPRESSION: Superior dislocation of right femoral prosthesis. Electronically Signed   By: Marijo Conception, M.D.   On: 11/12/2017 14:44    Procedures .Ortho Injury Treatment Date/Time: 11/12/2017 2:33 PM Performed by: Lennice Sites, DO Authorized by: Lennice Sites, DO   Consent:    Consent obtained:  Written   Consent given by:  Patient   Risks discussed:  Fracture, irreducible dislocation, nerve damage, recurrent dislocation, restricted joint movement, stiffness and vascular damage   Alternatives discussed:  No treatmentInjury location: hip Location details: right hip Injury type: dislocation Spontaneous dislocation: yes Prosthesis: yes Pre-procedure neurovascular assessment: neurovascularly intact Pre-procedure distal perfusion: normal Pre-procedure neurological function: normal Pre-procedure range of motion:  reduced  Patient sedated: Yes. Refer to sedation procedure documentation for details of sedation. Manipulation performed: yes Reduction method: manipulation with external rotation and traction. Reduction successful: yes X-ray confirmed reduction:  yes Immobilization: brace and crutches Splint type: knee immobilzer. Post-procedure neurovascular assessment: post-procedure neurovascularly intact Post-procedure distal perfusion: normal Post-procedure neurological function: normal Post-procedure range of motion: normal Patient tolerance: Patient tolerated the procedure well with no immediate complications    (including critical care time)  Medications Ordered in ED Medications  fentaNYL (SUBLIMAZE) injection 100 mcg (100 mcg Intravenous Given 11/12/17 1418)  lactated ringers bolus 1,000 mL (0 mLs Intravenous Stopped 11/12/17 1621)  propofol (DIPRIVAN) 10 mg/mL bolus/IV push 109.8 mg (109.8 mg Intravenous Given 11/12/17 1528)  ondansetron (ZOFRAN) injection 4 mg (4 mg Intravenous Given 11/12/17 1452)  propofol (DIPRIVAN) 10 mg/mL bolus/IV push (40 mg Intravenous Given 11/12/17 1532)     Initial Impression / Assessment and Plan / ED Course  I have reviewed the triage vital signs and the nursing notes.  Pertinent labs & imaging results that were available during my care of the patient were reviewed by me and considered in my medical decision making (see chart for details).     Deborah Maldonado is a 52 year old female with history of asthma, status post right hip repair who presents to the ED with right hip pain and concern for dislocation.  Patient with normal vitals upon arrival.  No fever.  Patient states that she bent over to pick up something off the floor and her right hip popped out.  She has done this twice before.  Original surgery was in 2016 with Dr. Mayer Camel.  Patient is neurovascularly intact on exam.  She is rotated.  Concern for dislocation.  X-rays confirmed superior dislocation of  the right hip prosthesis.  No fracture.  Dr. Venora Maples sedated the patient under propofol and dislocation was reduced by myself.  Postreduction x-ray showed improved alignment.  Patient was placed in a knee immobilizer.  Contacted Dr. Damita Dunnings physician assistant and they will follow-up with the patient tomorrow for further operative management/planning.  Patient given Norco prescription as she does not have any active narcotic prescriptions on [place] opioid database.  She remained hemodynamically stable my care and handled sedation well.  She was given crutches and discharged from ED in good condition.  Told to return to the ED if symptoms worsen.  Final Clinical Impressions(s) / ED Diagnoses   Final diagnoses:  Pain  Dislocation of right hip, initial encounter [facility])    ED Discharge Orders         Ordered    HYDROcodone-acetaminophen (NORCO/VICODIN) 5-325 MG tablet  Every 4 hours PRN     11/12/17 1556           Eleonora Peeler, Quita Skye, DO 11/12/17 Elizabeth, Kenvir, DO 11/12/17 1914

## 2017-11-12 NOTE — ED Provider Notes (Signed)
.  Sedation Performed by: Jola Schmidt, MD Authorized by: Jola Schmidt, MD   Consent:    Consent obtained:  Verbal   Consent given by:  Patient   Risks discussed:  Allergic reaction, inadequate sedation, prolonged hypoxia resulting in organ damage, prolonged sedation necessitating reversal, vomiting, nausea and respiratory compromise necessitating ventilatory assistance and intubation Universal protocol:    Procedure explained and questions answered to patient or proxy's satisfaction: yes     Relevant documents present and verified: yes     Required blood products, implants, devices, and special equipment available: yes     Immediately prior to procedure a time out was called: yes     Patient identity confirmation method:  Verbally with patient Indications:    Procedure necessitating sedation performed by:  Different physician   Intended level of sedation:  Deep Pre-sedation assessment:    Time since last food or drink:  Considered   NPO status caution: urgency dictates proceeding with non-ideal NPO status     ASA classification: class 2 - patient with mild systemic disease     Neck mobility: normal     Mouth opening:  2 finger widths   Thyromental distance:  3 finger widths   Mallampati score:  II - soft palate, uvula, fauces visible   Pre-sedation assessments completed and reviewed: airway patency, cardiovascular function, hydration status, mental status, nausea/vomiting, pain level, respiratory function and temperature   Immediate pre-procedure details:    Reassessment: Patient reassessed immediately prior to procedure     Reviewed: vital signs, relevant labs/tests and NPO status     Verified: bag valve mask available, emergency equipment available, intubation equipment available, IV patency confirmed, oxygen available and suction available   Procedure details (see MAR for exact dosages):    Preoxygenation:  Nasal cannula   Sedation:  Propofol   Intra-procedure monitoring:  Blood  pressure monitoring, cardiac monitor, continuous pulse oximetry, frequent LOC assessments, frequent vital sign checks and continuous capnometry   Intra-procedure events: none     Intra-procedure management:  Airway repositioning and BVM ventilation   Total Provider sedation time (minutes):  17 Post-procedure details:    Attendance: Constant attendance by certified staff until patient recovered     Recovery: Patient returned to pre-procedure baseline     Post-sedation assessments completed and reviewed: airway patency, cardiovascular function, hydration status, mental status, nausea/vomiting, pain level, respiratory function and temperature     Patient is stable for discharge or admission: yes     Patient tolerance:  Tolerated well, no immediate complications      Jola Schmidt, MD 11/12/17 [phone]

## 2017-11-13 DIAGNOSIS — M25551 Pain in right hip: Secondary | ICD-10-CM | POA: Diagnosis not present

## 2017-11-18 DIAGNOSIS — M4322 Fusion of spine, cervical region: Secondary | ICD-10-CM | POA: Diagnosis not present

## 2017-11-20 DIAGNOSIS — M25551 Pain in right hip: Secondary | ICD-10-CM | POA: Diagnosis not present

## 2017-11-20 DIAGNOSIS — Z471 Aftercare following joint replacement surgery: Secondary | ICD-10-CM | POA: Diagnosis not present

## 2017-11-20 DIAGNOSIS — Z96641 Presence of right artificial hip joint: Secondary | ICD-10-CM | POA: Diagnosis not present

## 2017-11-26 DIAGNOSIS — M7061 Trochanteric bursitis, right hip: Secondary | ICD-10-CM | POA: Diagnosis not present

## 2017-11-27 DIAGNOSIS — E785 Hyperlipidemia, unspecified: Secondary | ICD-10-CM | POA: Diagnosis not present

## 2017-11-27 DIAGNOSIS — Z23 Encounter for immunization: Secondary | ICD-10-CM | POA: Diagnosis not present

## 2017-11-27 DIAGNOSIS — D509 Iron deficiency anemia, unspecified: Secondary | ICD-10-CM | POA: Diagnosis not present

## 2017-11-27 DIAGNOSIS — E039 Hypothyroidism, unspecified: Secondary | ICD-10-CM | POA: Diagnosis not present

## 2017-12-04 DIAGNOSIS — R7301 Impaired fasting glucose: Secondary | ICD-10-CM | POA: Diagnosis not present

## 2018-01-06 DIAGNOSIS — G4733 Obstructive sleep apnea (adult) (pediatric): Secondary | ICD-10-CM | POA: Diagnosis not present

## 2018-01-07 DIAGNOSIS — G4733 Obstructive sleep apnea (adult) (pediatric): Secondary | ICD-10-CM | POA: Diagnosis not present

## 2018-01-07 DIAGNOSIS — J301 Allergic rhinitis due to pollen: Secondary | ICD-10-CM | POA: Diagnosis not present

## 2018-01-07 DIAGNOSIS — J4520 Mild intermittent asthma, uncomplicated: Secondary | ICD-10-CM | POA: Diagnosis not present

## 2018-02-19 DIAGNOSIS — S01502A Unspecified open wound of oral cavity, initial encounter: Secondary | ICD-10-CM | POA: Diagnosis not present

## 2018-03-06 DIAGNOSIS — K1329 Other disturbances of oral epithelium, including tongue: Secondary | ICD-10-CM | POA: Diagnosis not present

## 2018-03-06 DIAGNOSIS — K1239 Other oral mucositis (ulcerative): Secondary | ICD-10-CM | POA: Diagnosis not present

## 2018-03-13 DIAGNOSIS — L439 Lichen planus, unspecified: Secondary | ICD-10-CM | POA: Diagnosis not present

## 2018-03-13 DIAGNOSIS — J019 Acute sinusitis, unspecified: Secondary | ICD-10-CM | POA: Diagnosis not present

## 2018-03-19 DIAGNOSIS — Z8719 Personal history of other diseases of the digestive system: Secondary | ICD-10-CM | POA: Diagnosis not present

## 2018-03-19 DIAGNOSIS — K1379 Other lesions of oral mucosa: Secondary | ICD-10-CM | POA: Diagnosis not present

## 2018-03-19 DIAGNOSIS — D490 Neoplasm of unspecified behavior of digestive system: Secondary | ICD-10-CM | POA: Diagnosis not present

## 2018-03-21 DIAGNOSIS — M79669 Pain in unspecified lower leg: Secondary | ICD-10-CM | POA: Diagnosis not present

## 2018-03-22 DIAGNOSIS — M25551 Pain in right hip: Secondary | ICD-10-CM | POA: Diagnosis not present

## 2018-03-22 DIAGNOSIS — Z96641 Presence of right artificial hip joint: Secondary | ICD-10-CM | POA: Diagnosis not present

## 2018-03-22 DIAGNOSIS — E785 Hyperlipidemia, unspecified: Secondary | ICD-10-CM | POA: Diagnosis not present

## 2018-03-22 DIAGNOSIS — Z01818 Encounter for other preprocedural examination: Secondary | ICD-10-CM | POA: Diagnosis not present

## 2018-03-22 DIAGNOSIS — R2689 Other abnormalities of gait and mobility: Secondary | ICD-10-CM | POA: Diagnosis not present

## 2018-03-22 DIAGNOSIS — F319 Bipolar disorder, unspecified: Secondary | ICD-10-CM | POA: Diagnosis not present

## 2018-03-22 DIAGNOSIS — Z23 Encounter for immunization: Secondary | ICD-10-CM | POA: Diagnosis not present

## 2018-03-22 DIAGNOSIS — E039 Hypothyroidism, unspecified: Secondary | ICD-10-CM | POA: Diagnosis not present

## 2018-03-22 DIAGNOSIS — I10 Essential (primary) hypertension: Secondary | ICD-10-CM | POA: Diagnosis not present

## 2018-03-22 DIAGNOSIS — M797 Fibromyalgia: Secondary | ICD-10-CM | POA: Diagnosis not present

## 2018-03-22 DIAGNOSIS — G8918 Other acute postprocedural pain: Secondary | ICD-10-CM | POA: Diagnosis not present

## 2018-03-22 DIAGNOSIS — X501XXA Overexertion from prolonged static or awkward postures, initial encounter: Secondary | ICD-10-CM | POA: Diagnosis not present

## 2018-03-22 DIAGNOSIS — T84020A Dislocation of internal right hip prosthesis, initial encounter: Secondary | ICD-10-CM | POA: Diagnosis not present

## 2018-03-22 DIAGNOSIS — Z880 Allergy status to penicillin: Secondary | ICD-10-CM | POA: Diagnosis not present

## 2018-03-23 DIAGNOSIS — I10 Essential (primary) hypertension: Secondary | ICD-10-CM | POA: Diagnosis not present

## 2018-03-23 DIAGNOSIS — E039 Hypothyroidism, unspecified: Secondary | ICD-10-CM | POA: Diagnosis not present

## 2018-03-23 DIAGNOSIS — E785 Hyperlipidemia, unspecified: Secondary | ICD-10-CM | POA: Diagnosis not present

## 2018-03-23 DIAGNOSIS — G8918 Other acute postprocedural pain: Secondary | ICD-10-CM | POA: Diagnosis not present

## 2018-03-24 DIAGNOSIS — M24451 Recurrent dislocation, right hip: Secondary | ICD-10-CM | POA: Diagnosis not present

## 2018-04-02 DIAGNOSIS — Z96649 Presence of unspecified artificial hip joint: Secondary | ICD-10-CM | POA: Diagnosis not present

## 2018-04-02 DIAGNOSIS — Z96641 Presence of right artificial hip joint: Secondary | ICD-10-CM | POA: Diagnosis not present

## 2018-04-02 DIAGNOSIS — T84098A Other mechanical complication of other internal joint prosthesis, initial encounter: Secondary | ICD-10-CM | POA: Diagnosis not present

## 2018-04-08 DIAGNOSIS — Z96641 Presence of right artificial hip joint: Secondary | ICD-10-CM | POA: Diagnosis not present

## 2018-04-08 DIAGNOSIS — M47816 Spondylosis without myelopathy or radiculopathy, lumbar region: Secondary | ICD-10-CM | POA: Diagnosis not present

## 2018-04-08 DIAGNOSIS — S73004A Unspecified dislocation of right hip, initial encounter: Secondary | ICD-10-CM | POA: Diagnosis not present

## 2018-04-08 DIAGNOSIS — Z471 Aftercare following joint replacement surgery: Secondary | ICD-10-CM | POA: Diagnosis not present

## 2018-04-16 DIAGNOSIS — T84098A Other mechanical complication of other internal joint prosthesis, initial encounter: Secondary | ICD-10-CM | POA: Diagnosis not present

## 2018-04-16 DIAGNOSIS — M5416 Radiculopathy, lumbar region: Secondary | ICD-10-CM | POA: Diagnosis not present

## 2018-04-24 DIAGNOSIS — T84098A Other mechanical complication of other internal joint prosthesis, initial encounter: Secondary | ICD-10-CM | POA: Diagnosis not present

## 2018-04-24 DIAGNOSIS — Z96649 Presence of unspecified artificial hip joint: Secondary | ICD-10-CM | POA: Diagnosis not present

## 2018-04-24 DIAGNOSIS — Y839 Surgical procedure, unspecified as the cause of abnormal reaction of the patient, or of later complication, without mention of misadventure at the time of the procedure: Secondary | ICD-10-CM | POA: Diagnosis not present

## 2018-04-24 DIAGNOSIS — I10 Essential (primary) hypertension: Secondary | ICD-10-CM | POA: Diagnosis not present

## 2018-04-25 DIAGNOSIS — T84098A Other mechanical complication of other internal joint prosthesis, initial encounter: Secondary | ICD-10-CM | POA: Diagnosis not present

## 2018-04-25 DIAGNOSIS — Z96641 Presence of right artificial hip joint: Secondary | ICD-10-CM | POA: Diagnosis not present

## 2018-04-28 DIAGNOSIS — G8929 Other chronic pain: Secondary | ICD-10-CM | POA: Diagnosis not present

## 2018-04-28 DIAGNOSIS — Z79890 Hormone replacement therapy (postmenopausal): Secondary | ICD-10-CM | POA: Diagnosis not present

## 2018-04-28 DIAGNOSIS — M810 Age-related osteoporosis without current pathological fracture: Secondary | ICD-10-CM | POA: Diagnosis not present

## 2018-04-28 DIAGNOSIS — F319 Bipolar disorder, unspecified: Secondary | ICD-10-CM | POA: Diagnosis not present

## 2018-04-28 DIAGNOSIS — M797 Fibromyalgia: Secondary | ICD-10-CM | POA: Diagnosis not present

## 2018-04-28 DIAGNOSIS — E079 Disorder of thyroid, unspecified: Secondary | ICD-10-CM | POA: Diagnosis not present

## 2018-04-28 DIAGNOSIS — T84020A Dislocation of internal right hip prosthesis, initial encounter: Secondary | ICD-10-CM | POA: Diagnosis not present

## 2018-04-28 DIAGNOSIS — G8918 Other acute postprocedural pain: Secondary | ICD-10-CM | POA: Diagnosis not present

## 2018-04-28 DIAGNOSIS — Z79891 Long term (current) use of opiate analgesic: Secondary | ICD-10-CM | POA: Diagnosis not present

## 2018-04-28 DIAGNOSIS — G4733 Obstructive sleep apnea (adult) (pediatric): Secondary | ICD-10-CM | POA: Diagnosis not present

## 2018-04-28 DIAGNOSIS — M25551 Pain in right hip: Secondary | ICD-10-CM | POA: Diagnosis not present

## 2018-04-28 DIAGNOSIS — Z8614 Personal history of Methicillin resistant Staphylococcus aureus infection: Secondary | ICD-10-CM | POA: Diagnosis not present

## 2018-04-28 DIAGNOSIS — T84098A Other mechanical complication of other internal joint prosthesis, initial encounter: Secondary | ICD-10-CM | POA: Diagnosis not present

## 2018-04-28 DIAGNOSIS — Z9181 History of falling: Secondary | ICD-10-CM | POA: Diagnosis not present

## 2018-04-28 DIAGNOSIS — B001 Herpesviral vesicular dermatitis: Secondary | ICD-10-CM | POA: Diagnosis not present

## 2018-04-28 DIAGNOSIS — Z981 Arthrodesis status: Secondary | ICD-10-CM | POA: Diagnosis not present

## 2018-04-28 DIAGNOSIS — I10 Essential (primary) hypertension: Secondary | ICD-10-CM | POA: Diagnosis not present

## 2018-04-28 DIAGNOSIS — M1612 Unilateral primary osteoarthritis, left hip: Secondary | ICD-10-CM | POA: Diagnosis not present

## 2018-04-28 DIAGNOSIS — I872 Venous insufficiency (chronic) (peripheral): Secondary | ICD-10-CM | POA: Diagnosis not present

## 2018-04-28 DIAGNOSIS — J45909 Unspecified asthma, uncomplicated: Secondary | ICD-10-CM | POA: Diagnosis not present

## 2018-04-28 DIAGNOSIS — Z96641 Presence of right artificial hip joint: Secondary | ICD-10-CM | POA: Diagnosis not present

## 2018-04-28 DIAGNOSIS — Z79899 Other long term (current) drug therapy: Secondary | ICD-10-CM | POA: Diagnosis not present

## 2018-04-29 DIAGNOSIS — G8929 Other chronic pain: Secondary | ICD-10-CM | POA: Diagnosis not present

## 2018-04-29 DIAGNOSIS — G4733 Obstructive sleep apnea (adult) (pediatric): Secondary | ICD-10-CM | POA: Diagnosis not present

## 2018-04-29 DIAGNOSIS — M797 Fibromyalgia: Secondary | ICD-10-CM | POA: Diagnosis not present

## 2018-04-29 DIAGNOSIS — M25551 Pain in right hip: Secondary | ICD-10-CM | POA: Diagnosis not present

## 2018-05-02 DIAGNOSIS — Z96649 Presence of unspecified artificial hip joint: Secondary | ICD-10-CM | POA: Diagnosis not present

## 2018-05-02 DIAGNOSIS — T84098A Other mechanical complication of other internal joint prosthesis, initial encounter: Secondary | ICD-10-CM | POA: Diagnosis not present

## 2018-05-06 DIAGNOSIS — E079 Disorder of thyroid, unspecified: Secondary | ICD-10-CM | POA: Diagnosis not present

## 2018-05-06 DIAGNOSIS — T84020D Dislocation of internal right hip prosthesis, subsequent encounter: Secondary | ICD-10-CM | POA: Diagnosis not present

## 2018-05-06 DIAGNOSIS — I4510 Unspecified right bundle-branch block: Secondary | ICD-10-CM | POA: Diagnosis not present

## 2018-05-06 DIAGNOSIS — D171 Benign lipomatous neoplasm of skin and subcutaneous tissue of trunk: Secondary | ICD-10-CM | POA: Diagnosis not present

## 2018-05-06 DIAGNOSIS — I10 Essential (primary) hypertension: Secondary | ICD-10-CM | POA: Diagnosis not present

## 2018-05-06 DIAGNOSIS — I872 Venous insufficiency (chronic) (peripheral): Secondary | ICD-10-CM | POA: Diagnosis not present

## 2018-05-06 DIAGNOSIS — J45909 Unspecified asthma, uncomplicated: Secondary | ICD-10-CM | POA: Diagnosis not present

## 2018-05-06 DIAGNOSIS — F319 Bipolar disorder, unspecified: Secondary | ICD-10-CM | POA: Diagnosis not present

## 2018-05-06 DIAGNOSIS — M5416 Radiculopathy, lumbar region: Secondary | ICD-10-CM | POA: Diagnosis not present

## 2018-05-06 DIAGNOSIS — M5000 Cervical disc disorder with myelopathy, unspecified cervical region: Secondary | ICD-10-CM | POA: Diagnosis not present

## 2018-05-06 DIAGNOSIS — G4733 Obstructive sleep apnea (adult) (pediatric): Secondary | ICD-10-CM | POA: Diagnosis not present

## 2018-05-08 DIAGNOSIS — I10 Essential (primary) hypertension: Secondary | ICD-10-CM | POA: Diagnosis not present

## 2018-05-08 DIAGNOSIS — E079 Disorder of thyroid, unspecified: Secondary | ICD-10-CM | POA: Diagnosis not present

## 2018-05-08 DIAGNOSIS — J45909 Unspecified asthma, uncomplicated: Secondary | ICD-10-CM | POA: Diagnosis not present

## 2018-05-08 DIAGNOSIS — I4510 Unspecified right bundle-branch block: Secondary | ICD-10-CM | POA: Diagnosis not present

## 2018-05-08 DIAGNOSIS — F319 Bipolar disorder, unspecified: Secondary | ICD-10-CM | POA: Diagnosis not present

## 2018-05-08 DIAGNOSIS — M5000 Cervical disc disorder with myelopathy, unspecified cervical region: Secondary | ICD-10-CM | POA: Diagnosis not present

## 2018-05-08 DIAGNOSIS — I872 Venous insufficiency (chronic) (peripheral): Secondary | ICD-10-CM | POA: Diagnosis not present

## 2018-05-08 DIAGNOSIS — T84020D Dislocation of internal right hip prosthesis, subsequent encounter: Secondary | ICD-10-CM | POA: Diagnosis not present

## 2018-05-08 DIAGNOSIS — D171 Benign lipomatous neoplasm of skin and subcutaneous tissue of trunk: Secondary | ICD-10-CM | POA: Diagnosis not present

## 2018-05-08 DIAGNOSIS — G4733 Obstructive sleep apnea (adult) (pediatric): Secondary | ICD-10-CM | POA: Diagnosis not present

## 2018-05-08 DIAGNOSIS — M5416 Radiculopathy, lumbar region: Secondary | ICD-10-CM | POA: Diagnosis not present

## 2018-05-09 DIAGNOSIS — J45909 Unspecified asthma, uncomplicated: Secondary | ICD-10-CM | POA: Diagnosis not present

## 2018-05-09 DIAGNOSIS — E079 Disorder of thyroid, unspecified: Secondary | ICD-10-CM | POA: Diagnosis not present

## 2018-05-09 DIAGNOSIS — T84020D Dislocation of internal right hip prosthesis, subsequent encounter: Secondary | ICD-10-CM | POA: Diagnosis not present

## 2018-05-09 DIAGNOSIS — I10 Essential (primary) hypertension: Secondary | ICD-10-CM | POA: Diagnosis not present

## 2018-05-09 DIAGNOSIS — M5416 Radiculopathy, lumbar region: Secondary | ICD-10-CM | POA: Diagnosis not present

## 2018-05-09 DIAGNOSIS — I872 Venous insufficiency (chronic) (peripheral): Secondary | ICD-10-CM | POA: Diagnosis not present

## 2018-05-09 DIAGNOSIS — F319 Bipolar disorder, unspecified: Secondary | ICD-10-CM | POA: Diagnosis not present

## 2018-05-09 DIAGNOSIS — D171 Benign lipomatous neoplasm of skin and subcutaneous tissue of trunk: Secondary | ICD-10-CM | POA: Diagnosis not present

## 2018-05-09 DIAGNOSIS — G4733 Obstructive sleep apnea (adult) (pediatric): Secondary | ICD-10-CM | POA: Diagnosis not present

## 2018-05-09 DIAGNOSIS — I4510 Unspecified right bundle-branch block: Secondary | ICD-10-CM | POA: Diagnosis not present

## 2018-05-09 DIAGNOSIS — M5000 Cervical disc disorder with myelopathy, unspecified cervical region: Secondary | ICD-10-CM | POA: Diagnosis not present

## 2018-05-12 DIAGNOSIS — E079 Disorder of thyroid, unspecified: Secondary | ICD-10-CM | POA: Diagnosis not present

## 2018-05-12 DIAGNOSIS — F319 Bipolar disorder, unspecified: Secondary | ICD-10-CM | POA: Diagnosis not present

## 2018-05-12 DIAGNOSIS — G4733 Obstructive sleep apnea (adult) (pediatric): Secondary | ICD-10-CM | POA: Diagnosis not present

## 2018-05-12 DIAGNOSIS — I4510 Unspecified right bundle-branch block: Secondary | ICD-10-CM | POA: Diagnosis not present

## 2018-05-12 DIAGNOSIS — M5000 Cervical disc disorder with myelopathy, unspecified cervical region: Secondary | ICD-10-CM | POA: Diagnosis not present

## 2018-05-12 DIAGNOSIS — D171 Benign lipomatous neoplasm of skin and subcutaneous tissue of trunk: Secondary | ICD-10-CM | POA: Diagnosis not present

## 2018-05-12 DIAGNOSIS — J45909 Unspecified asthma, uncomplicated: Secondary | ICD-10-CM | POA: Diagnosis not present

## 2018-05-12 DIAGNOSIS — I872 Venous insufficiency (chronic) (peripheral): Secondary | ICD-10-CM | POA: Diagnosis not present

## 2018-05-12 DIAGNOSIS — I10 Essential (primary) hypertension: Secondary | ICD-10-CM | POA: Diagnosis not present

## 2018-05-12 DIAGNOSIS — M5416 Radiculopathy, lumbar region: Secondary | ICD-10-CM | POA: Diagnosis not present

## 2018-05-12 DIAGNOSIS — T84020D Dislocation of internal right hip prosthesis, subsequent encounter: Secondary | ICD-10-CM | POA: Diagnosis not present

## 2018-05-14 DIAGNOSIS — I10 Essential (primary) hypertension: Secondary | ICD-10-CM | POA: Diagnosis not present

## 2018-05-14 DIAGNOSIS — T84020D Dislocation of internal right hip prosthesis, subsequent encounter: Secondary | ICD-10-CM | POA: Diagnosis not present

## 2018-05-14 DIAGNOSIS — M5000 Cervical disc disorder with myelopathy, unspecified cervical region: Secondary | ICD-10-CM | POA: Diagnosis not present

## 2018-05-14 DIAGNOSIS — G4733 Obstructive sleep apnea (adult) (pediatric): Secondary | ICD-10-CM | POA: Diagnosis not present

## 2018-05-14 DIAGNOSIS — E079 Disorder of thyroid, unspecified: Secondary | ICD-10-CM | POA: Diagnosis not present

## 2018-05-14 DIAGNOSIS — D171 Benign lipomatous neoplasm of skin and subcutaneous tissue of trunk: Secondary | ICD-10-CM | POA: Diagnosis not present

## 2018-05-14 DIAGNOSIS — I4510 Unspecified right bundle-branch block: Secondary | ICD-10-CM | POA: Diagnosis not present

## 2018-05-14 DIAGNOSIS — J45909 Unspecified asthma, uncomplicated: Secondary | ICD-10-CM | POA: Diagnosis not present

## 2018-05-14 DIAGNOSIS — F319 Bipolar disorder, unspecified: Secondary | ICD-10-CM | POA: Diagnosis not present

## 2018-05-14 DIAGNOSIS — M5416 Radiculopathy, lumbar region: Secondary | ICD-10-CM | POA: Diagnosis not present

## 2018-05-14 DIAGNOSIS — I872 Venous insufficiency (chronic) (peripheral): Secondary | ICD-10-CM | POA: Diagnosis not present

## 2018-05-29 DIAGNOSIS — Z4789 Encounter for other orthopedic aftercare: Secondary | ICD-10-CM | POA: Diagnosis not present

## 2018-05-29 DIAGNOSIS — Z96649 Presence of unspecified artificial hip joint: Secondary | ICD-10-CM | POA: Diagnosis not present

## 2018-05-29 DIAGNOSIS — M6281 Muscle weakness (generalized): Secondary | ICD-10-CM | POA: Diagnosis not present

## 2018-06-02 DIAGNOSIS — M5136 Other intervertebral disc degeneration, lumbar region: Secondary | ICD-10-CM | POA: Diagnosis not present

## 2018-06-02 DIAGNOSIS — M5416 Radiculopathy, lumbar region: Secondary | ICD-10-CM | POA: Diagnosis not present

## 2018-06-03 DIAGNOSIS — E785 Hyperlipidemia, unspecified: Secondary | ICD-10-CM | POA: Diagnosis not present

## 2018-06-03 DIAGNOSIS — Z4789 Encounter for other orthopedic aftercare: Secondary | ICD-10-CM | POA: Diagnosis not present

## 2018-06-03 DIAGNOSIS — E039 Hypothyroidism, unspecified: Secondary | ICD-10-CM | POA: Diagnosis not present

## 2018-06-03 DIAGNOSIS — M6281 Muscle weakness (generalized): Secondary | ICD-10-CM | POA: Diagnosis not present

## 2018-06-03 DIAGNOSIS — E876 Hypokalemia: Secondary | ICD-10-CM | POA: Diagnosis not present

## 2018-06-03 DIAGNOSIS — R7301 Impaired fasting glucose: Secondary | ICD-10-CM | POA: Diagnosis not present

## 2018-06-03 DIAGNOSIS — D509 Iron deficiency anemia, unspecified: Secondary | ICD-10-CM | POA: Diagnosis not present

## 2018-06-03 DIAGNOSIS — Z96649 Presence of unspecified artificial hip joint: Secondary | ICD-10-CM | POA: Diagnosis not present

## 2018-06-09 DIAGNOSIS — M810 Age-related osteoporosis without current pathological fracture: Secondary | ICD-10-CM | POA: Diagnosis not present

## 2018-06-09 DIAGNOSIS — Z13820 Encounter for screening for osteoporosis: Secondary | ICD-10-CM | POA: Diagnosis not present

## 2018-06-09 DIAGNOSIS — N6322 Unspecified lump in the left breast, upper inner quadrant: Secondary | ICD-10-CM | POA: Diagnosis not present

## 2018-06-09 DIAGNOSIS — N6321 Unspecified lump in the left breast, upper outer quadrant: Secondary | ICD-10-CM | POA: Diagnosis not present

## 2018-08-05 DIAGNOSIS — K0510 Chronic gingivitis, plaque induced: Secondary | ICD-10-CM | POA: Diagnosis not present

## 2018-08-05 DIAGNOSIS — K1379 Other lesions of oral mucosa: Secondary | ICD-10-CM | POA: Diagnosis not present

## 2018-10-02 DIAGNOSIS — R600 Localized edema: Secondary | ICD-10-CM | POA: Diagnosis not present

## 2018-10-02 DIAGNOSIS — J309 Allergic rhinitis, unspecified: Secondary | ICD-10-CM | POA: Diagnosis not present

## 2018-10-02 DIAGNOSIS — R635 Abnormal weight gain: Secondary | ICD-10-CM | POA: Diagnosis not present

## 2018-10-02 DIAGNOSIS — Z6839 Body mass index (BMI) 39.0-39.9, adult: Secondary | ICD-10-CM | POA: Diagnosis not present

## 2018-10-13 DIAGNOSIS — R234 Changes in skin texture: Secondary | ICD-10-CM | POA: Diagnosis not present

## 2018-10-13 DIAGNOSIS — L438 Other lichen planus: Secondary | ICD-10-CM | POA: Diagnosis not present

## 2018-10-13 DIAGNOSIS — R21 Rash and other nonspecific skin eruption: Secondary | ICD-10-CM | POA: Diagnosis not present

## 2018-10-24 ENCOUNTER — Encounter: Payer: Self-pay | Admitting: Gastroenterology

## 2018-10-28 DIAGNOSIS — Z5181 Encounter for therapeutic drug level monitoring: Secondary | ICD-10-CM | POA: Diagnosis not present

## 2018-10-28 DIAGNOSIS — L438 Other lichen planus: Secondary | ICD-10-CM | POA: Diagnosis not present

## 2018-11-17 DIAGNOSIS — D509 Iron deficiency anemia, unspecified: Secondary | ICD-10-CM | POA: Diagnosis not present

## 2018-11-17 DIAGNOSIS — E785 Hyperlipidemia, unspecified: Secondary | ICD-10-CM | POA: Diagnosis not present

## 2018-11-17 DIAGNOSIS — Z1331 Encounter for screening for depression: Secondary | ICD-10-CM | POA: Diagnosis not present

## 2018-11-17 DIAGNOSIS — R7301 Impaired fasting glucose: Secondary | ICD-10-CM | POA: Diagnosis not present

## 2018-11-17 DIAGNOSIS — E039 Hypothyroidism, unspecified: Secondary | ICD-10-CM | POA: Diagnosis not present

## 2018-11-17 DIAGNOSIS — R635 Abnormal weight gain: Secondary | ICD-10-CM | POA: Diagnosis not present

## 2018-12-09 DIAGNOSIS — R234 Changes in skin texture: Secondary | ICD-10-CM | POA: Diagnosis not present

## 2018-12-09 DIAGNOSIS — Z5181 Encounter for therapeutic drug level monitoring: Secondary | ICD-10-CM | POA: Diagnosis not present

## 2018-12-09 DIAGNOSIS — B370 Candidal stomatitis: Secondary | ICD-10-CM | POA: Diagnosis not present

## 2018-12-09 DIAGNOSIS — L438 Other lichen planus: Secondary | ICD-10-CM | POA: Diagnosis not present

## 2019-01-22 DIAGNOSIS — K5904 Chronic idiopathic constipation: Secondary | ICD-10-CM | POA: Diagnosis not present

## 2019-01-22 DIAGNOSIS — Z791 Long term (current) use of non-steroidal anti-inflammatories (NSAID): Secondary | ICD-10-CM | POA: Diagnosis not present

## 2019-01-22 DIAGNOSIS — R1013 Epigastric pain: Secondary | ICD-10-CM | POA: Diagnosis not present

## 2019-01-27 DIAGNOSIS — N390 Urinary tract infection, site not specified: Secondary | ICD-10-CM | POA: Diagnosis not present

## 2019-02-03 DIAGNOSIS — Z5181 Encounter for therapeutic drug level monitoring: Secondary | ICD-10-CM | POA: Diagnosis not present

## 2019-02-03 DIAGNOSIS — B009 Herpesviral infection, unspecified: Secondary | ICD-10-CM | POA: Diagnosis not present

## 2019-02-03 DIAGNOSIS — L438 Other lichen planus: Secondary | ICD-10-CM | POA: Diagnosis not present

## 2019-02-03 DIAGNOSIS — Z79899 Other long term (current) drug therapy: Secondary | ICD-10-CM | POA: Diagnosis not present

## 2019-02-03 DIAGNOSIS — B370 Candidal stomatitis: Secondary | ICD-10-CM | POA: Diagnosis not present

## 2019-02-04 DIAGNOSIS — N6002 Solitary cyst of left breast: Secondary | ICD-10-CM | POA: Diagnosis not present

## 2019-02-04 DIAGNOSIS — R928 Other abnormal and inconclusive findings on diagnostic imaging of breast: Secondary | ICD-10-CM | POA: Diagnosis not present

## 2019-02-17 DIAGNOSIS — E039 Hypothyroidism, unspecified: Secondary | ICD-10-CM | POA: Diagnosis not present

## 2019-02-17 DIAGNOSIS — R7301 Impaired fasting glucose: Secondary | ICD-10-CM | POA: Diagnosis not present

## 2019-02-17 DIAGNOSIS — E785 Hyperlipidemia, unspecified: Secondary | ICD-10-CM | POA: Diagnosis not present

## 2019-02-17 DIAGNOSIS — E876 Hypokalemia: Secondary | ICD-10-CM | POA: Diagnosis not present

## 2019-02-17 DIAGNOSIS — D509 Iron deficiency anemia, unspecified: Secondary | ICD-10-CM | POA: Diagnosis not present

## 2019-02-18 DIAGNOSIS — Z1211 Encounter for screening for malignant neoplasm of colon: Secondary | ICD-10-CM | POA: Diagnosis not present

## 2019-02-18 DIAGNOSIS — K635 Polyp of colon: Secondary | ICD-10-CM | POA: Diagnosis not present

## 2019-02-18 DIAGNOSIS — K2970 Gastritis, unspecified, without bleeding: Secondary | ICD-10-CM | POA: Diagnosis not present

## 2019-02-18 DIAGNOSIS — Z791 Long term (current) use of non-steroidal anti-inflammatories (NSAID): Secondary | ICD-10-CM | POA: Diagnosis not present

## 2019-02-18 DIAGNOSIS — R1013 Epigastric pain: Secondary | ICD-10-CM | POA: Diagnosis not present

## 2023-12-11 ENCOUNTER — Other Ambulatory Visit: Payer: Self-pay | Admitting: Medical Genetics

## 2024-04-01 ENCOUNTER — Other Ambulatory Visit: Payer: Self-pay | Admitting: Medical Genetics

## 2024-04-01 DIAGNOSIS — Z006 Encounter for examination for normal comparison and control in clinical research program: Secondary | ICD-10-CM
